# Patient Record
Sex: Female | Born: 1955 | Race: White | Hispanic: No | Marital: Married | Smoking: Former smoker
Health system: Southern US, Community
[De-identification: ages and names within clinical notes are randomized; demographics above are authoritative.]

---

## 2005-10-26 ENCOUNTER — Ambulatory Visit: Payer: Self-pay

## 2007-03-21 ENCOUNTER — Ambulatory Visit: Payer: Self-pay

## 2008-06-11 ENCOUNTER — Ambulatory Visit: Payer: Self-pay

## 2009-07-31 ENCOUNTER — Ambulatory Visit: Payer: Self-pay | Admitting: Internal Medicine

## 2009-08-09 ENCOUNTER — Ambulatory Visit: Payer: Self-pay | Admitting: Internal Medicine

## 2010-01-31 ENCOUNTER — Ambulatory Visit: Payer: Self-pay

## 2011-10-29 ENCOUNTER — Ambulatory Visit: Payer: Self-pay

## 2012-01-08 ENCOUNTER — Emergency Department: Payer: Self-pay | Admitting: Emergency Medicine

## 2012-07-16 ENCOUNTER — Ambulatory Visit: Payer: Self-pay | Admitting: General Surgery

## 2012-07-20 ENCOUNTER — Ambulatory Visit: Payer: Self-pay | Admitting: Medical

## 2012-11-06 ENCOUNTER — Ambulatory Visit: Payer: Self-pay | Admitting: Obstetrics and Gynecology

## 2015-02-17 DIAGNOSIS — F419 Anxiety disorder, unspecified: Secondary | ICD-10-CM | POA: Insufficient documentation

## 2017-10-28 ENCOUNTER — Emergency Department: Payer: Commercial Managed Care - PPO

## 2017-10-28 ENCOUNTER — Encounter: Payer: Self-pay | Admitting: Emergency Medicine

## 2017-10-28 ENCOUNTER — Other Ambulatory Visit: Payer: Self-pay

## 2017-10-28 ENCOUNTER — Inpatient Hospital Stay
Admission: EM | Admit: 2017-10-28 | Discharge: 2017-10-30 | DRG: 392 | Disposition: A | Discharge status: Home / Self Care | Payer: Commercial Managed Care - PPO | Attending: Internal Medicine | Admitting: Internal Medicine

## 2017-10-28 DIAGNOSIS — K2970 Gastritis, unspecified, without bleeding: Secondary | ICD-10-CM | POA: Diagnosis present

## 2017-10-28 DIAGNOSIS — Z888 Allergy status to other drugs, medicaments and biological substances status: Secondary | ICD-10-CM | POA: Diagnosis not present

## 2017-10-28 DIAGNOSIS — Z87891 Personal history of nicotine dependence: Secondary | ICD-10-CM | POA: Diagnosis not present

## 2017-10-28 DIAGNOSIS — Z79899 Other long term (current) drug therapy: Secondary | ICD-10-CM | POA: Diagnosis not present

## 2017-10-28 DIAGNOSIS — K922 Gastrointestinal hemorrhage, unspecified: Secondary | ICD-10-CM | POA: Diagnosis not present

## 2017-10-28 DIAGNOSIS — K5792 Diverticulitis of intestine, part unspecified, without perforation or abscess without bleeding: Secondary | ICD-10-CM | POA: Diagnosis present

## 2017-10-28 DIAGNOSIS — K5732 Diverticulitis of large intestine without perforation or abscess without bleeding: Secondary | ICD-10-CM | POA: Diagnosis present

## 2017-10-28 DIAGNOSIS — K269 Duodenal ulcer, unspecified as acute or chronic, without hemorrhage or perforation: Secondary | ICD-10-CM

## 2017-10-28 DIAGNOSIS — D62 Acute posthemorrhagic anemia: Secondary | ICD-10-CM | POA: Diagnosis present

## 2017-10-28 DIAGNOSIS — R109 Unspecified abdominal pain: Secondary | ICD-10-CM | POA: Diagnosis present

## 2017-10-28 DIAGNOSIS — K921 Melena: Secondary | ICD-10-CM

## 2017-10-28 LAB — COMPREHENSIVE METABOLIC PANEL
ALT: 19 U/L (ref 14–54)
AST: 25 U/L (ref 15–41)
Albumin: 4 g/dL (ref 3.5–5.0)
Alkaline Phosphatase: 64 U/L (ref 38–126)
Anion gap: 10 (ref 5–15)
BUN: 15 mg/dL (ref 6–20)
CHLORIDE: 100 mmol/L — AB (ref 101–111)
CO2: 24 mmol/L (ref 22–32)
CREATININE: 0.61 mg/dL (ref 0.44–1.00)
Calcium: 9 mg/dL (ref 8.9–10.3)
Glucose, Bld: 151 mg/dL — ABNORMAL HIGH (ref 65–99)
POTASSIUM: 3.9 mmol/L (ref 3.5–5.1)
SODIUM: 134 mmol/L — AB (ref 135–145)
Total Bilirubin: 0.7 mg/dL (ref 0.3–1.2)
Total Protein: 7.4 g/dL (ref 6.5–8.1)

## 2017-10-28 LAB — URINALYSIS, COMPLETE (UACMP) WITH MICROSCOPIC
BACTERIA UA: NONE SEEN
BILIRUBIN URINE: NEGATIVE
Glucose, UA: NEGATIVE mg/dL
Hgb urine dipstick: NEGATIVE
KETONES UR: 20 mg/dL — AB
LEUKOCYTES UA: NEGATIVE
Nitrite: NEGATIVE
PROTEIN: 30 mg/dL — AB
Specific Gravity, Urine: 1.023 (ref 1.005–1.030)
pH: 5 (ref 5.0–8.0)

## 2017-10-28 LAB — HEMOGLOBIN AND HEMATOCRIT, BLOOD
HCT: 26 % — ABNORMAL LOW (ref 35.0–47.0)
HEMATOCRIT: 26.7 % — AB (ref 35.0–47.0)
HEMATOCRIT: 24.8 % — AB (ref 35.0–47.0)
HEMOGLOBIN: 9.2 g/dL — AB (ref 12.0–16.0)
Hemoglobin: 8.7 g/dL — ABNORMAL LOW (ref 12.0–16.0)
Hemoglobin: 8.2 g/dL — ABNORMAL LOW (ref 12.0–16.0)

## 2017-10-28 LAB — CBC
HEMATOCRIT: 33 % — AB (ref 35.0–47.0)
Hemoglobin: 11.3 g/dL — ABNORMAL LOW (ref 12.0–16.0)
MCH: 31 pg (ref 26.0–34.0)
MCHC: 34.3 g/dL (ref 32.0–36.0)
MCV: 90.4 fL (ref 80.0–100.0)
Platelets: 311 10*3/uL (ref 150–440)
RBC: 3.65 MIL/uL — AB (ref 3.80–5.20)
RDW: 13.7 % (ref 11.5–14.5)
WBC: 11.3 10*3/uL — AB (ref 3.6–11.0)

## 2017-10-28 LAB — TYPE AND SCREEN
ABO/RH(D): O POS
ANTIBODY SCREEN: NEGATIVE

## 2017-10-28 LAB — LIPASE, BLOOD: LIPASE: 24 U/L (ref 11–51)

## 2017-10-28 LAB — GLUCOSE, CAPILLARY: Glucose-Capillary: 100 mg/dL — ABNORMAL HIGH (ref 65–99)

## 2017-10-28 MED ORDER — IOPAMIDOL (ISOVUE-300) INJECTION 61%
30.0000 mL | Freq: Once | INTRAVENOUS | Status: AC | PRN
  Administered 2017-10-28: 30 mL via ORAL

## 2017-10-28 MED ORDER — GI COCKTAIL ~~LOC~~
30.0000 mL | Freq: Once | ORAL | Status: AC
  Administered 2017-10-28: 30 mL via ORAL
  Filled 2017-10-28: qty 30

## 2017-10-28 MED ORDER — METRONIDAZOLE IN NACL 5-0.79 MG/ML-% IV SOLN
500.0000 mg | Freq: Three times a day (TID) | INTRAVENOUS | Status: DC
  Administered 2017-10-28 – 2017-10-30 (×6): 500 mg via INTRAVENOUS
  Filled 2017-10-28 (×9): qty 100

## 2017-10-28 MED ORDER — ALBUTEROL SULFATE (2.5 MG/3ML) 0.083% IN NEBU: 3.0000 mL | INHALATION_SOLUTION | Freq: Four times a day (QID) | RESPIRATORY_TRACT | Status: DC | PRN

## 2017-10-28 MED ORDER — PANTOPRAZOLE SODIUM 40 MG IV SOLR
40.0000 mg | Freq: Once | INTRAVENOUS | Status: AC
  Administered 2017-10-28: 40 mg via INTRAVENOUS
  Filled 2017-10-28: qty 40

## 2017-10-28 MED ORDER — SODIUM CHLORIDE 0.9% FLUSH
3.0000 mL | Freq: Two times a day (BID) | INTRAVENOUS | Status: DC
  Administered 2017-10-28 – 2017-10-30 (×5): 3 mL via INTRAVENOUS

## 2017-10-28 MED ORDER — IOPAMIDOL (ISOVUE-300) INJECTION 61%
100.0000 mL | Freq: Once | INTRAVENOUS | Status: AC | PRN
  Administered 2017-10-28: 100 mL via INTRAVENOUS

## 2017-10-28 MED ORDER — CIPROFLOXACIN IN D5W 400 MG/200ML IV SOLN
400.0000 mg | Freq: Two times a day (BID) | INTRAVENOUS | Status: DC
  Filled 2017-10-28 (×2): qty 200

## 2017-10-28 MED ORDER — ONDANSETRON HCL 4 MG PO TABS: 4.0000 mg | ORAL_TABLET | Freq: Four times a day (QID) | ORAL | Status: DC | PRN

## 2017-10-28 MED ORDER — CIPROFLOXACIN IN D5W 400 MG/200ML IV SOLN
400.0000 mg | Freq: Two times a day (BID) | INTRAVENOUS | Status: DC
  Administered 2017-10-28 – 2017-10-30 (×4): 400 mg via INTRAVENOUS
  Filled 2017-10-28 (×5): qty 200

## 2017-10-28 MED ORDER — METRONIDAZOLE IN NACL 5-0.79 MG/ML-% IV SOLN
500.0000 mg | Freq: Once | INTRAVENOUS | Status: AC
  Administered 2017-10-28: 500 mg via INTRAVENOUS
  Filled 2017-10-28: qty 100

## 2017-10-28 MED ORDER — ONDANSETRON HCL 4 MG/2ML IJ SOLN: 4.0000 mg | Freq: Four times a day (QID) | INTRAMUSCULAR | Status: DC | PRN

## 2017-10-28 MED ORDER — PIPERACILLIN-TAZOBACTAM 3.375 G IVPB 30 MIN
3.3750 g | Freq: Once | INTRAVENOUS | Status: DC
  Filled 2017-10-28: qty 50

## 2017-10-28 MED ORDER — CIPROFLOXACIN IN D5W 400 MG/200ML IV SOLN
400.0000 mg | Freq: Once | INTRAVENOUS | Status: AC
  Administered 2017-10-28: 400 mg via INTRAVENOUS
  Filled 2017-10-28: qty 200

## 2017-10-28 MED ORDER — MORPHINE SULFATE (PF) 2 MG/ML IV SOLN: 2.0000 mg | INTRAVENOUS | Status: DC | PRN

## 2017-10-28 MED ORDER — FENTANYL CITRATE (PF) 100 MCG/2ML IJ SOLN
50.0000 ug | Freq: Once | INTRAMUSCULAR | Status: AC
  Administered 2017-10-28: 50 ug via INTRAVENOUS
  Filled 2017-10-28: qty 2

## 2017-10-28 MED ORDER — PANTOPRAZOLE SODIUM 40 MG IV SOLR
40.0000 mg | Freq: Two times a day (BID) | INTRAVENOUS | Status: DC
  Administered 2017-10-28 – 2017-10-30 (×5): 40 mg via INTRAVENOUS
  Filled 2017-10-28 (×6): qty 40

## 2017-10-28 MED ORDER — SODIUM CHLORIDE 0.9 % IV SOLN
INTRAVENOUS | Status: AC
  Administered 2017-10-28: 11:00:00 via INTRAVENOUS

## 2017-10-28 MED ORDER — TRAMADOL HCL 50 MG PO TABS
50.0000 mg | ORAL_TABLET | Freq: Four times a day (QID) | ORAL | Status: DC | PRN
  Administered 2017-10-28 – 2017-10-29 (×2): 50 mg via ORAL
  Filled 2017-10-28 (×2): qty 1

## 2017-10-28 MED ORDER — SODIUM CHLORIDE 0.9 % IV BOLUS (SEPSIS)
1000.0000 mL | Freq: Once | INTRAVENOUS | Status: AC
  Administered 2017-10-28: 1000 mL via INTRAVENOUS

## 2017-10-28 MED ORDER — ACETAMINOPHEN 325 MG PO TABS
650.0000 mg | ORAL_TABLET | Freq: Four times a day (QID) | ORAL | Status: DC | PRN
  Administered 2017-10-30: 650 mg via ORAL
  Filled 2017-10-28: qty 2

## 2017-10-28 MED ORDER — ACETAMINOPHEN 650 MG RE SUPP: 650.0000 mg | Freq: Four times a day (QID) | RECTAL | Status: DC | PRN

## 2017-10-28 NOTE — ED Provider Notes (Signed)
[facility] Emergency Department Provider Note  ____________________________________________   I have reviewed the triage vital signs and the nursing notes.   HISTORY  Chief Complaint Abdominal Pain    HPI Vida RiggerFrances Paladino is a 61 y.o. female who states she has had a C-section remotely otherwise no abdominal pathology, presents today complaining of left-sided abdominal pain that radiates now towards her epigastric region.  She has had no appetite.  She has not had diarrhea but she has had some loose stools which were "black".  No history of GI bleeding in the past.  Nothing makes the pain worse nothing makes it better.  She has not had any food for the last 2 days that she has no appetite.  No history of diverticulitis.  She did have remote colonoscopy which she reports is negative.  She has not tried anything at home to make this better, she she has had some subjective fever at home but does not taken her temperature.  It was gradual in onset, sharp/dull      History reviewed. No pertinent past medical history.  There are no active problems to display for this patient.   History reviewed. No pertinent surgical history.  Prior to Admission medications   Not on File    Allergies Patient has no known allergies.  History reviewed. No pertinent family history.  Social History Social History   Tobacco Use  . Smoking status: Former Smoker    Last attempt to quit: 2017    Years since quitting: 1.8  . Smokeless tobacco: Never Used  Substance Use Topics  . Alcohol use: No    Frequency: Never  . Drug use: No    Review of Systems Constitutional: No fever/chills Eyes: No visual changes. ENT: No sore throat. No stiff neck no neck pain Cardiovascular: Denies chest pain. Respiratory: Denies shortness of breath. Gastrointestinal:   no vomiting.  No diarrhea.  No constipation. Genitourinary: Negative for dysuria. Musculoskeletal: Negative lower  extremity swelling Skin: Negative for rash. Neurological: Negative for severe headaches, focal weakness or numbness.   ____________________________________________   PHYSICAL EXAM:  VITAL SIGNS: ED Triage Vitals  Enc Vitals Group     BP 10/28/17 0632 (!) 152/92     Pulse Rate 10/28/17 0632 (!) 125     Resp 10/28/17 0632 20     Temp 10/28/17 0632 98.3 F (36.8 C)     Temp Source 10/28/17 0632 Oral     SpO2 10/28/17 0632 97 %     Weight 10/28/17 0632 143 lb (64.9 kg)     Height 10/28/17 0632 5' (1.524 m)     Head Circumference --      Peak Flow --      Pain Score 10/28/17 0631 6     Pain Loc --      Pain Edu? --      Excl. in GC? --     Constitutional: Alert and oriented. Well appearing and in no acute distress. Eyes: Conjunctivae are normal Head: Atraumatic HEENT: No congestion/rhinnorhea. Mucous membranes are moist.  Oropharynx non-erythematous Neck:   Nontender with no meningismus, no masses, no stridor Cardiovascular: Normal rate, regular rhythm. Grossly normal heart sounds.  Good peripheral circulation. Respiratory: Normal respiratory effort.  No retractions. Lungs CTAB. Abdominal: Soft and tender to palpation in the left upper quadrant left mid to mid and lower abdomen as well as epigastric.  No guarding or rebound Back:  There is no focal tenderness or step off.  there  is no midline tenderness there are no lesions noted. there is no CVA tenderness Musculoskeletal: No lower extremity tenderness, no upper extremity tenderness. No joint effusions, no DVT signs strong distal pulses no edema Neurologic:  Normal speech and language. No gross focal neurologic deficits are appreciated.  Skin:  Skin is warm, dry and intact. No rash noted. Psychiatric: Mood and affect are normal. Speech and behavior are normal.  ____________________________________________   LABS (all labs ordered are listed, but only abnormal results are displayed)  Labs Reviewed  COMPREHENSIVE METABOLIC  PANEL - Abnormal; Notable for the following components:      Result Value   Sodium 134 (*)    Chloride 100 (*)    Glucose, Bld 151 (*)    All other components within normal limits  CBC - Abnormal; Notable for the following components:   WBC 11.3 (*)    RBC 3.65 (*)    Hemoglobin 11.3 (*)    HCT 33.0 (*)    All other components within normal limits  URINALYSIS, COMPLETE (UACMP) WITH MICROSCOPIC - Abnormal; Notable for the following components:   Color, Urine YELLOW (*)    APPearance HAZY (*)    Ketones, ur 20 (*)    Protein, ur 30 (*)    Squamous Epithelial / LPF 0-5 (*)    All other components within normal limits  LIPASE, BLOOD  TYPE AND SCREEN    Pertinent labs  results that were available during my care of the patient were reviewed by me and considered in my medical decision making (see chart for details). ____________________________________________  EKG  I personally interpreted any EKGs ordered by me or triage Sinus rate 105,T elevation or depression is a right bundle branch block noted ____________________________________________  RADIOLOGY  Pertinent labs & imaging results that were available during my care of the patient were reviewed by me and considered in my medical decision making (see chart for details). If possible, patient and/or family made aware of any abnormal findings. ____________________________________________    PROCEDURES  Procedure(s) performed: None  Procedures  Critical Care performed: None  ____________________________________________   INITIAL IMPRESSION / ASSESSMENT AND PLAN / ED COURSE  Pertinent labs & imaging results that were available during my care of the patient were reviewed by me and considered in my medical decision making (see chart for details).  Patient here for very reproducible abdominal pain, guaiac positive rectal exam, female nurse chaperone present.  I did a CT scan which shows diverticular inflammation which we  are giving her antibiotics for as well as evidence of duodenitis, which is likely responsible for the bright red blood per rectum.  She was somewhat tachycardic upon arrival here we are giving her fluids, I think patient will likely given this constellation of events including black stool, tachycardia, diverticulitis, with no ability to eat at home benefit from hospital admission for further observation with further evaluation.    ____________________________________________   FINAL CLINICAL IMPRESSION(S) / ED DIAGNOSES  Final diagnoses:  None      This chart was dictated using voice recognition software.  Despite best efforts to proofread,  errors can occur which can change meaning.      Jeanmarie PlantMcShane, Dajour Pierpoint A, MD 10/28/17 [id number]

## 2017-10-28 NOTE — H&P (Signed)
[facility] [place] [facility]   PATIENT NAME: Bailey Dorsey    MR#:  [id number]  DATE OF BIRTH:  [birth year]  DATE OF ADMISSION:  10/28/2017  PRIMARY CARE PHYSICIAN: Patient, No Pcp Per   REQUESTING/REFERRING PHYSICIAN: Neta MendsMc Shane  CHIEF COMPLAINT:   Abdominal pain HISTORY OF PRESENT ILLNESS:  Bailey RiggerFrances Oehler  is a 61 y.o. female with a known history of no significant past medical history is presenting to the ED with a chief complaint of left-sided abdominal pain radiating to epigastric area.  Patient also is reporting black colored stool.  Denies any chest pain.  Could not tolerate diet because of the pain.  CT abdomen has revealed a diverticulitis of the sigmoid colon and possible duodenitis.  Hospitalist team was called to admit the patient.  PAST MEDICAL HISTORY:  History reviewed. No pertinent past medical history.  PAST SURGICAL HISTOIRY:  C-section  SOCIAL HISTORY:   Social History   Tobacco Use  . Smoking status: Former Smoker    Last attempt to quit: 2017    Years since quitting: 1.8  . Smokeless tobacco: Never Used  Substance Use Topics  . Alcohol use: No    Frequency: Never    FAMILY HISTORY:   Family History  Problem Relation Age of Onset  . Heart Problems Mother   . Renal Disease Mother   . Heart attack Father   . Diabetes Sister     DRUG ALLERGIES:   Allergies  Allergen Reactions  . Amoxicillin-Pot Clavulanate Nausea And Vomiting    REVIEW OF SYSTEMS:  CONSTITUTIONAL: No fever, fatigue or weakness.  EYES: No blurred or double vision.  EARS, NOSE, AND THROAT: No tinnitus or ear pain.  RESPIRATORY: No cough, shortness of breath, wheezing or hemoptysis.  CARDIOVASCULAR: No chest pain, orthopnea, edema.  GASTROINTESTINAL: Epigastric and left upper quadrant abdominal pain nausea denies any vomiting.  Reporting black tarry stool GENITOURINARY: No dysuria, hematuria.  ENDOCRINE: No polyuria, nocturia,  HEMATOLOGY:  No anemia, easy bruising or bleeding SKIN: No rash or lesion. MUSCULOSKELETAL: No joint pain or arthritis.   NEUROLOGIC: No tingling, numbness, weakness.  PSYCHIATRY: No anxiety or depression.   MEDICATIONS AT HOME:   Prior to Admission medications   Medication Sig Start Date End Date Taking? Authorizing Provider  albuterol (PROAIR HFA) 108 (90 Base) MCG/ACT inhaler Inhale 2 puffs every 6 (six) hours as needed into the lungs for wheezing. 09/13/17 09/13/18 Yes [provider]  cetirizine (ZYRTEC) 10 MG tablet Take 10 mg daily as needed by mouth for allergies.   Yes [provider]  ciprofloxacin (CIPRO) 500 MG tablet Take 500 mg 2 (two) times daily by mouth. 10/25/17  Yes [provider]  omeprazole (PRILOSEC OTC) 20 MG tablet Take 20 mg daily by mouth. 10/25/17   [provider]      VITAL SIGNS:  Blood pressure 125/64, pulse (!) 102, temperature 99.1 F (37.3 C), temperature source Oral, resp. rate 18, height 5' (1.524 m), weight 64.4 kg (142 lb), SpO2 100 %.  PHYSICAL EXAMINATION:  GENERAL:  61 y.o.-year-old patient lying in the bed with no acute distress.  EYES: Pupils equal, round, reactive to light and accommodation. No scleral icterus. Extraocular muscles intact.  HEENT: Head atraumatic, normocephalic. Oropharynx and nasopharynx clear.  NECK:  Supple, no jugular venous distention. No thyroid enlargement, no tenderness.  LUNGS: Normal breath sounds bilaterally, no wheezing, rales,rhonchi or crepitation. No use of accessory muscles of respiration.  CARDIOVASCULAR: S1, S2 normal.  No murmurs, rubs, or gallops.  ABDOMEN: Soft, left upper quadrant and epigastric area are tender but no rebound tenderness, nondistended. Bowel sounds present. No organomegaly or mass.  EXTREMITIES: No pedal edema, cyanosis, or clubbing.  NEUROLOGIC: Cranial nerves II through XII are intact. Muscle strength 5/5 in all extremities. Sensation intact. Gait not checked.   PSYCHIATRIC: The patient is alert and oriented x 3.  SKIN: No obvious rash, lesion, or ulcer.   LABORATORY PANEL:   CBC Recent Labs  Lab 10/28/17 0633 10/28/17 1117  WBC 11.3*  --   HGB 11.3* 9.2*  HCT 33.0* 26.7*  PLT 311  --    ------------------------------------------------------------------------------------------------------------------  Chemistries  Recent Labs  Lab 10/28/17 0633  NA 134*  K 3.9  CL 100*  CO2 24  GLUCOSE 151*  BUN 15  CREATININE 0.61  CALCIUM 9.0  AST 25  ALT 19  ALKPHOS 64  BILITOT 0.7   ------------------------------------------------------------------------------------------------------------------  Cardiac Enzymes No results for input(s): TROPONINI in the last 168 hours. ------------------------------------------------------------------------------------------------------------------  RADIOLOGY:  Ct Abdomen Pelvis W Contrast  Result Date: 10/28/2017 CLINICAL DATA:  Vomiting scratched abdominal pain, unspecified. Umbilical pain for 4 days. Vomiting. Black stools. Weight gain. EXAM: CT ABDOMEN AND PELVIS WITH CONTRAST TECHNIQUE: Multidetector CT imaging of the abdomen and pelvis was performed using the standard protocol following bolus administration of intravenous contrast. CONTRAST:  30mL ISOVUE-300 IOPAMIDOL (ISOVUE-300) INJECTION 61%, 100mL ISOVUE-300 IOPAMIDOL (ISOVUE-300) INJECTION 61% COMPARISON:  None. FINDINGS: Lower chest: The lung bases are clear without focal nodule, mass, or airspace disease. Heart size is normal. Hepatobiliary: No focal liver abnormality is seen. No gallstones, gallbladder wall thickening, or biliary dilatation. Pancreas: Unremarkable. No pancreatic ductal dilatation or surrounding inflammatory changes. Spleen: Normal in size without focal abnormality. Adrenals/Urinary Tract: The adrenal glands are normal. Kidneys and ureters are within normal limits. No focal mass lesion is present. There is no stone. The urinary  bladder is within normal limits. Stomach/Bowel: The proximal stomach is within normal limits. There is eccentric thickening and edema of the inferior medial duodenum. No discrete mass lesion is present. Small bowel is unremarkable. Contrast is present throughout the colon without obstruction or mass. The ileocolic junction is normal. The appendix is visualized and within normal limits. The ascending and transverse colon are within normal limits. Descending colon is normal. Diverticular changes are present throughout the sigmoid colon. Focal inflammatory changes are present with some edema into the mesenteries. There is no focal fluid collection or free air. Vascular/Lymphatic: Aortic Atherosclerosis (ICD10-I70.0). There is no aneurysm or stenosis. Inflammatory nodes are present about the duodenum. No other significant adenopathy is present. Reproductive: Uterus and bilateral adnexa are unremarkable. Other: No abdominal wall hernia or abnormality. No abdominopelvic ascites. Musculoskeletal: Degenerative changes are noted in the facet joints bilaterally. Slight anterolisthesis is present at L5-S1. AP alignment is otherwise anatomic. IMPRESSION: 1. Sigmoid diverticulitis without complicating features. 2. Asymmetric thickening and edema of the duodenum is likely related to inflammation. Endoscopy would be useful to evaluate for inflammation versus neoplasm. Subcentimeter lymph nodes appear inflammatory. 3. Aortic atherosclerosis without aneurysm. These results were called by telephone at the time of interpretation on 10/28/2017 at 8:57 am to Dr. Ileana RoupJAMES MCSHANE , who verbally acknowledged these results. Electronically Signed   By: Marin Robertshristopher  Mattern M.D.   On: 10/28/2017 09:00    EKG:  No orders found for this or any previous visit.  IMPRESSION AND PLAN:   61 year old female presenting to the ED with a chief complaint of acute left  upper quadrant and epigastric abdominal pain and not eating well for the past 2  days from the abdominal pain.  #Acute left upper quadrant and epigastric abdominal pain with melena secondary to acute duodenitis CT abdomen has revealed acute sigmoid diverticulitis Patient is admitted to MedSurg unit N.p.o. IV Protonix GI consult is placed Hemodynamically stable at this time monitor hemoglobin hematocrit and transfuse as needed Hydrate with IV fluids IV ciprofloxacin and Flagyl  #Acute sigmoid diverticulitis IV ciprofloxacin and Flagyl K.  IV fluids, pain management as needed  #Melena could be from duodenitis IV fluids, Protonix, n.p.o., GI consult, monitor hemoglobin and hematocrit every 4 hours    All the records are reviewed and case discussed with ED provider. Management plans discussed with the patient, she is  in agreement.  CODE STATUS: fc  TOTAL TIME TAKING CARE OF THIS PATIENT: 45 minutes.   Note: This dictation was prepared with Dragon dictation along with smaller phrase technology. Any transcriptional errors that result from this process are unintentional.  Ramonita Lab M.D on 10/28/2017 at 3:00 PM  Between 7am to 6pm - Pager - [phone]  After 6pm go to [email or web address] - password [email or web address]  [facility]  Office  [phone]  CC: Primary care physician; Patient, No Pcp Per

## 2017-10-28 NOTE — ED Notes (Signed)
Patient transported to CT 

## 2017-10-28 NOTE — Consult Note (Signed)
Bailey Miniumarren Bronson Bressman, MD [facility]  [address] [place], [place] [address] Phone: [phone] Fax : [phone]  Consultation  Referring Provider:     Dr. Amado CoeGouru Primary Care Physician:  Patient, No Pcp Per Primary Gastroenterologist:  Gentry FitzUnassigned         Reason for Consultation:     Abdominal pain  Date of Admission:  10/28/2017 Date of Consultation:  10/28/2017         HPI:   Bailey Dorsey is a 61 y.o. female Who is admitted to the ER with abdominal pain that was in the left side.  The patient also reports that she had some epigastric pain.  Patient states she has been taking BC powders for 30 years but started to have an upset stomach about a week ago when stop taking BC powders.  The patient also reports that she is been having black stools although they became a little lighter today.  She states that she could not eat because of the pain.  She had a CT scan that showed her to have diverticulitis in the sigmoid colon and possible duodenitis.  The patient also reports that she had a colonoscopy in 2014 by Dr. Lemar LivingsByrnett.  She states that she was not told that she had any diverticulosis at that time and also states that she did not have any polyps at that time. She also reports that her abdominal pain has completely resolved. The patient's hemoglobin on admission was 11.3 that went down to 9.2 this afternoon and is 8.7 this evening.  History reviewed. No pertinent past medical history.  History reviewed. No pertinent surgical history.  Prior to Admission medications   Medication Sig Start Date End Date Taking? Authorizing Provider  albuterol (PROAIR HFA) 108 (90 Base) MCG/ACT inhaler Inhale 2 puffs every 6 (six) hours as needed into the lungs for wheezing. 09/13/17 09/13/18 Yes [provider]  cetirizine (ZYRTEC) 10 MG tablet Take 10 mg daily as needed by mouth for allergies.   Yes [provider]  ciprofloxacin (CIPRO) 500 MG tablet Take 500 mg 2 (two) times daily by  mouth. 10/25/17  Yes [provider]  omeprazole (PRILOSEC OTC) 20 MG tablet Take 20 mg daily by mouth. 10/25/17   [provider]    Family History  Problem Relation Age of Onset  . Heart Problems Mother   . Renal Disease Mother   . Heart attack Father   . Diabetes Sister      Social History   Tobacco Use  . Smoking status: Former Smoker    Last attempt to quit: 2017    Years since quitting: 1.8  . Smokeless tobacco: Never Used  Substance Use Topics  . Alcohol use: No    Frequency: Never  . Drug use: No    Allergies as of 10/28/2017 - Review Complete 10/28/2017  Allergen Reaction Noted  . Amoxicillin-pot clavulanate Nausea And Vomiting 09/06/2016    Review of Systems:    All systems reviewed and negative except where noted in HPI.   Physical Exam:  Vital signs in last 24 hours: Temp:  [98.3 F (36.8 C)-99.1 F (37.3 C)] 99.1 F (37.3 C) (11/12 1118) Pulse Rate:  [102-125] 102 (11/12 0930) Resp:  [17-20] 18 (11/12 1118) BP: (102-152)/(64-92) 125/64 (11/12 1118) SpO2:  [96 %-100 %] 100 % (11/12 1118) Weight:  [142 lb (64.4 kg)-143 lb (64.9 kg)] 142 lb (64.4 kg) (11/12 1118) Last BM Date: 10/28/17 General:   Pleasant, cooperative in NAD  Head:  Normocephalic and atraumatic. Eyes:   No icterus.   Conjunctiva pink. PERRLA. Ears:  Normal auditory acuity. Neck:  Supple; no masses or thyroidomegaly Lungs: Respirations even and unlabored. Lungs clear to auscultation bilaterally.   No wheezes, crackles, or rhonchi.  Heart:  Regular rate and rhythm;  Without murmur, clicks, rubs or gallops Abdomen:  Soft, nondistended, nontender. Normal bowel sounds. No appreciable masses or hepatomegaly.  No rebound or guarding.  Rectal:  Not performed. Msk:  Symmetrical without gross deformities.    Extremities:  Without edema, cyanosis or clubbing. Neurologic:  Alert and oriented x3;  grossly normal neurologically. Skin:  Intact without significant lesions or  rashes. Cervical Nodes:  No significant cervical adenopathy. Psych:  Alert and cooperative. Normal affect.  LAB RESULTS: Recent Labs    10/28/17 0633 10/28/17 1117 10/28/17 1630  WBC 11.3*  --   --   HGB 11.3* 9.2* 8.7*  HCT 33.0* 26.7* 26.0*  PLT 311  --   --    BMET Recent Labs    10/28/17 0633  NA 134*  K 3.9  CL 100*  CO2 24  GLUCOSE 151*  BUN 15  CREATININE 0.61  CALCIUM 9.0   LFT Recent Labs    10/28/17 0633  PROT 7.4  ALBUMIN 4.0  AST 25  ALT 19  ALKPHOS 64  BILITOT 0.7   PT/INR No results for input(s): LABPROT, INR in the last 72 hours.  STUDIES: Ct Abdomen Pelvis W Contrast  Result Date: 10/28/2017 CLINICAL DATA:  Vomiting scratched abdominal pain, unspecified. Umbilical pain for 4 days. Vomiting. Black stools. Weight gain. EXAM: CT ABDOMEN AND PELVIS WITH CONTRAST TECHNIQUE: Multidetector CT imaging of the abdomen and pelvis was performed using the standard protocol following bolus administration of intravenous contrast. CONTRAST:  30mL ISOVUE-300 IOPAMIDOL (ISOVUE-300) INJECTION 61%, 100mL ISOVUE-300 IOPAMIDOL (ISOVUE-300) INJECTION 61% COMPARISON:  None. FINDINGS: Lower chest: The lung bases are clear without focal nodule, mass, or airspace disease. Heart size is normal. Hepatobiliary: No focal liver abnormality is seen. No gallstones, gallbladder wall thickening, or biliary dilatation. Pancreas: Unremarkable. No pancreatic ductal dilatation or surrounding inflammatory changes. Spleen: Normal in size without focal abnormality. Adrenals/Urinary Tract: The adrenal glands are normal. Kidneys and ureters are within normal limits. No focal mass lesion is present. There is no stone. The urinary bladder is within normal limits. Stomach/Bowel: The proximal stomach is within normal limits. There is eccentric thickening and edema of the inferior medial duodenum. No discrete mass lesion is present. Small bowel is unremarkable. Contrast is present throughout the colon  without obstruction or mass. The ileocolic junction is normal. The appendix is visualized and within normal limits. The ascending and transverse colon are within normal limits. Descending colon is normal. Diverticular changes are present throughout the sigmoid colon. Focal inflammatory changes are present with some edema into the mesenteries. There is no focal fluid collection or free air. Vascular/Lymphatic: Aortic Atherosclerosis (ICD10-I70.0). There is no aneurysm or stenosis. Inflammatory nodes are present about the duodenum. No other significant adenopathy is present. Reproductive: Uterus and bilateral adnexa are unremarkable. Other: No abdominal wall hernia or abnormality. No abdominopelvic ascites. Musculoskeletal: Degenerative changes are noted in the facet joints bilaterally. Slight anterolisthesis is present at L5-S1. AP alignment is otherwise anatomic. IMPRESSION: 1. Sigmoid diverticulitis without complicating features. 2. Asymmetric thickening and edema of the duodenum is likely related to inflammation. Endoscopy would be useful to evaluate for inflammation versus neoplasm. Subcentimeter lymph nodes appear inflammatory. 3. Aortic atherosclerosis without aneurysm. These  results were called by telephone at the time of interpretation on 10/28/2017 at 8:57 am to Dr. Ileana Roup , who verbally acknowledged these results. Electronically Signed   By: Marin Roberts M.D.   On: 10/28/2017 09:00      Impression / Plan:   Bailey Dorsey is a 61 y.o. y/o female who is admitted with abdominal pain and found to have diverticulitis. The patient has also had melena and a drop in her hemoglobin.  The patient was started on a clear liquid diet and had just eaten in and drank just before I came to see her today.  The patient will be set up for a EGD for tomorrow.  Patient reports that she has had a lot of friends to have had problems with EGDs and she is very nervous about going through this.  The patient  has been explained the risks and benefits of the procedure and risks of not doing the procedure and continue bleeding.  The patient agrees to undergo the upper endoscopy.  The patient has also been told because of her recent bout of diverticulitis and no history of diverticulosis from her last colonoscopy as reported by her, she should undergo a colonoscopy after discharge.  She should wait a minimum of 6 weeks after this bout of diverticulitis to undergo the colonoscopy.  The patient has been explained the plan and agrees with it.  Thank you for involving me in the care of this patient.      LOS: 0 days   Bailey Minium, MD  10/28/2017, 8:13 PM   Note: This dictation was prepared with Dragon dictation along with smaller phrase technology. Any transcriptional errors that result from this process are unintentional.

## 2017-10-28 NOTE — ED Triage Notes (Signed)
Patient ambulatory to triage with complaints of periumbilical pain 6/10 since Wednesday.  Intermittent dull aching.  Pt reports receiving abx from PCP visit on Friday for UTI. Pt reports dark stool and appears pale. No acute distress noted.

## 2017-10-29 ENCOUNTER — Encounter
Admission: EM | Disposition: A | Discharge status: Home / Self Care | Payer: Self-pay | Source: Home / Self Care | Attending: Internal Medicine

## 2017-10-29 ENCOUNTER — Inpatient Hospital Stay: Payer: Commercial Managed Care - PPO | Admitting: Anesthesiology

## 2017-10-29 DIAGNOSIS — K921 Melena: Secondary | ICD-10-CM

## 2017-10-29 DIAGNOSIS — K269 Duodenal ulcer, unspecified as acute or chronic, without hemorrhage or perforation: Secondary | ICD-10-CM

## 2017-10-29 HISTORY — PX: ESOPHAGOGASTRODUODENOSCOPY (EGD) WITH PROPOFOL: SHX5813

## 2017-10-29 LAB — IRON AND TIBC
IRON: 15 ug/dL — AB (ref 28–170)
Saturation Ratios: 5 % — ABNORMAL LOW (ref 10.4–31.8)
TIBC: 291 ug/dL (ref 250–450)
UIBC: 276 ug/dL

## 2017-10-29 LAB — COMPREHENSIVE METABOLIC PANEL
ALK PHOS: 40 U/L (ref 38–126)
ALT: 13 U/L — AB (ref 14–54)
AST: 20 U/L (ref 15–41)
Albumin: 2.9 g/dL — ABNORMAL LOW (ref 3.5–5.0)
Anion gap: 6 (ref 5–15)
BUN: 8 mg/dL (ref 6–20)
CALCIUM: 7.9 mg/dL — AB (ref 8.9–10.3)
CO2: 25 mmol/L (ref 22–32)
CREATININE: 0.5 mg/dL (ref 0.44–1.00)
Chloride: 107 mmol/L (ref 101–111)
Glucose, Bld: 92 mg/dL (ref 65–99)
Potassium: 3.5 mmol/L (ref 3.5–5.1)
Sodium: 138 mmol/L (ref 135–145)
Total Bilirubin: 0.5 mg/dL (ref 0.3–1.2)
Total Protein: 5.5 g/dL — ABNORMAL LOW (ref 6.5–8.1)

## 2017-10-29 LAB — CBC
HCT: 24.5 % — ABNORMAL LOW (ref 35.0–47.0)
Hemoglobin: 8 g/dL — ABNORMAL LOW (ref 12.0–16.0)
MCH: 30.5 pg (ref 26.0–34.0)
MCHC: 32.8 g/dL (ref 32.0–36.0)
MCV: 92.8 fL (ref 80.0–100.0)
Platelets: 247 10*3/uL (ref 150–440)
RBC: 2.64 MIL/uL — AB (ref 3.80–5.20)
RDW: 13.9 % (ref 11.5–14.5)
WBC: 7.3 10*3/uL (ref 3.6–11.0)

## 2017-10-29 LAB — FERRITIN: FERRITIN: 27 ng/mL (ref 11–307)

## 2017-10-29 LAB — VITAMIN B12: Vitamin B-12: 404 pg/mL (ref 180–914)

## 2017-10-29 LAB — RETICULOCYTES
RBC.: 2.83 MIL/uL — AB (ref 3.80–5.20)
RETIC COUNT ABSOLUTE: 104.7 10*3/uL (ref 19.0–183.0)
RETIC CT PCT: 3.7 % — AB (ref 0.4–3.1)

## 2017-10-29 LAB — FOLATE: Folate: 30 ng/mL (ref >5.9)

## 2017-10-29 LAB — HIV ANTIBODY (ROUTINE TESTING W REFLEX): HIV SCREEN 4TH GENERATION: NONREACTIVE

## 2017-10-29 SURGERY — ESOPHAGOGASTRODUODENOSCOPY (EGD) WITH PROPOFOL
Anesthesia: General

## 2017-10-29 MED ORDER — TEMAZEPAM 15 MG PO CAPS
15.0000 mg | ORAL_CAPSULE | Freq: Every evening | ORAL | Status: DC | PRN
  Administered 2017-10-29: 15 mg via ORAL
  Filled 2017-10-29 (×2): qty 1

## 2017-10-29 MED ORDER — PROPOFOL 10 MG/ML IV BOLUS
INTRAVENOUS | Status: DC | PRN
  Administered 2017-10-29: 20 mg via INTRAVENOUS
  Administered 2017-10-29: 50 mg via INTRAVENOUS

## 2017-10-29 MED ORDER — LIDOCAINE HCL (PF) 2 % IJ SOLN
INTRAMUSCULAR | Status: DC | PRN
  Administered 2017-10-29: 50 mg via INTRADERMAL

## 2017-10-29 MED ORDER — PROPOFOL 500 MG/50ML IV EMUL
INTRAVENOUS | Status: DC | PRN
  Administered 2017-10-29: 150 ug/kg/min via INTRAVENOUS

## 2017-10-29 MED ORDER — PROPOFOL 500 MG/50ML IV EMUL
INTRAVENOUS | Status: AC
  Filled 2017-10-29: qty 50

## 2017-10-29 MED ORDER — SODIUM CHLORIDE 0.9 % IV SOLN
INTRAVENOUS | Status: DC | PRN
  Administered 2017-10-29: 11:00:00 via INTRAVENOUS

## 2017-10-29 NOTE — Anesthesia Post-op Follow-up Note (Signed)
Anesthesia QCDR form completed.        

## 2017-10-29 NOTE — Progress Notes (Signed)
[facility] [place] [facility]   PATIENT NAME: Bailey RiggerFrances Dorsey    MR#:  [id number]  DATE OF BIRTH:  29-Dec-1955  SUBJECTIVE:   Patient feeling better this am no abdominal pain  REVIEW OF SYSTEMS:    Review of Systems  Constitutional: Negative for fever, chills weight loss HENT: Negative for ear pain, nosebleeds, congestion, facial swelling, rhinorrhea, neck pain, neck stiffness and ear discharge.   Respiratory: Negative for cough, shortness of breath, wheezing  Cardiovascular: Negative for chest pain, palpitations and leg swelling.  Gastrointestinal: Negative for heartburn, abdominal pain, vomiting, diarrhea or consitpation Genitourinary: Negative for dysuria, urgency, frequency, hematuria Musculoskeletal: Negative for back pain or joint pain Neurological: Negative for dizziness, seizures, syncope, focal weakness,  numbness and headaches.  Hematological: Does not bruise/bleed easily.  Psychiatric/Behavioral: Negative for hallucinations, confusion, dysphoric mood    Tolerating Diet: npo      DRUG ALLERGIES:   Allergies  Allergen Reactions  . Amoxicillin-Pot Clavulanate Nausea And Vomiting    VITALS:  Blood pressure (!) 98/41, pulse 84, temperature (!) 96.8 F (36 C), temperature source Tympanic, resp. rate 12, height 5' (1.524 m), weight 64.4 kg (142 lb), SpO2 97 %.  PHYSICAL EXAMINATION:  Constitutional: Appears well-developed and well-nourished. No distress. HENT: Normocephalic. Marland Kitchen. Oropharynx is clear and moist.  Eyes: Conjunctivae and EOM are normal. PERRLA, no scleral icterus.  Neck: Normal ROM. Neck supple. No JVD. No tracheal deviation. CVS: RRR, S1/S2 +, no murmurs, no gallops, no carotid bruit.  Pulmonary: Effort and breath sounds normal, no stridor, rhonchi, wheezes, rales.  Abdominal: Soft. BS +,  no distension, tenderness, rebound or guarding.  Musculoskeletal: Normal range of motion. No edema and no tenderness.  Neuro: Alert. CN 2-12  grossly intact. No focal deficits. Skin: Skin is warm and dry. No rash noted. Psychiatric: Normal mood and affect.      LABORATORY PANEL:   CBC Recent Labs  Lab 10/29/17 0423  WBC 7.3  HGB 8.0*  HCT 24.5*  PLT 247   ------------------------------------------------------------------------------------------------------------------  Chemistries  Recent Labs  Lab 10/29/17 0423  NA 138  K 3.5  CL 107  CO2 25  GLUCOSE 92  BUN 8  CREATININE 0.50  CALCIUM 7.9*  AST 20  ALT 13*  ALKPHOS 40  BILITOT 0.5   ------------------------------------------------------------------------------------------------------------------  Cardiac Enzymes No results for input(s): TROPONINI in the last 168 hours. ------------------------------------------------------------------------------------------------------------------  RADIOLOGY:  Ct Abdomen Pelvis W Contrast  Result Date: 10/28/2017 CLINICAL DATA:  Vomiting scratched abdominal pain, unspecified. Umbilical pain for 4 days. Vomiting. Black stools. Weight gain. EXAM: CT ABDOMEN AND PELVIS WITH CONTRAST TECHNIQUE: Multidetector CT imaging of the abdomen and pelvis was performed using the standard protocol following bolus administration of intravenous contrast. CONTRAST:  30mL ISOVUE-300 IOPAMIDOL (ISOVUE-300) INJECTION 61%, 100mL ISOVUE-300 IOPAMIDOL (ISOVUE-300) INJECTION 61% COMPARISON:  None. FINDINGS: Lower chest: The lung bases are clear without focal nodule, mass, or airspace disease. Heart size is normal. Hepatobiliary: No focal liver abnormality is seen. No gallstones, gallbladder wall thickening, or biliary dilatation. Pancreas: Unremarkable. No pancreatic ductal dilatation or surrounding inflammatory changes. Spleen: Normal in size without focal abnormality. Adrenals/Urinary Tract: The adrenal glands are normal. Kidneys and ureters are within normal limits. No focal mass lesion is present. There is no stone. The urinary bladder is  within normal limits. Stomach/Bowel: The proximal stomach is within normal limits. There is eccentric thickening and edema of the inferior medial duodenum. No discrete mass lesion is present. Small bowel is unremarkable. Contrast is  present throughout the colon without obstruction or mass. The ileocolic junction is normal. The appendix is visualized and within normal limits. The ascending and transverse colon are within normal limits. Descending colon is normal. Diverticular changes are present throughout the sigmoid colon. Focal inflammatory changes are present with some edema into the mesenteries. There is no focal fluid collection or free air. Vascular/Lymphatic: Aortic Atherosclerosis (ICD10-I70.0). There is no aneurysm or stenosis. Inflammatory nodes are present about the duodenum. No other significant adenopathy is present. Reproductive: Uterus and bilateral adnexa are unremarkable. Other: No abdominal wall hernia or abnormality. No abdominopelvic ascites. Musculoskeletal: Degenerative changes are noted in the facet joints bilaterally. Slight anterolisthesis is present at L5-S1. AP alignment is otherwise anatomic. IMPRESSION: 1. Sigmoid diverticulitis without complicating features. 2. Asymmetric thickening and edema of the duodenum is likely related to inflammation. Endoscopy would be useful to evaluate for inflammation versus neoplasm. Subcentimeter lymph nodes appear inflammatory. 3. Aortic atherosclerosis without aneurysm. These results were called by telephone at the time of interpretation on 10/28/2017 at 8:57 am to Dr. Ileana RoupJAMES MCSHANE , who verbally acknowledged these results. Electronically Signed   By: Marin Robertshristopher  Mattern M.D.   On: 10/28/2017 09:00     ASSESSMENT AND PLAN:   61 y/o female no PMHX here with abdominal pain and melena.  1. Sigmoid diverticulitis without complicating features: Continue CIPRO and F;agyl Colonoscopy pallned in 6-8 weeks as outpatient.  2. Melena: patient will  undergo EGD today Continue PPI Appreciate GI consult  3. Acute blood loss anemia" CBC for am No need for transfusion today     .       Management plans discussed with the patient and she is in agreement.  CODE STATUS: full  TOTAL TIME TAKING CARE OF THIS PATIENT: 30 minutes.     POSSIBLE D/C tomorrow, DEPENDING ON CLINICAL CONDITION.   Himani Corona M.D on 10/29/2017 at 12:39 PM  Between 7am to 6pm - Pager - [phone] After 6pm go to [email or web address] - password [email or web address]  [facility]  Office  [phone]  CC: Primary care physician; Patient, No Pcp Per  Note: This dictation was prepared with Dragon dictation along with smaller phrase technology. Any transcriptional errors that result from this process are unintentional.

## 2017-10-29 NOTE — Transfer of Care (Signed)
Immediate Anesthesia Transfer of Care Note  Patient: Bailey Dorsey  Procedure(s) Performed: ESOPHAGOGASTRODUODENOSCOPY (EGD) WITH PROPOFOL (N/A )  Patient Location: Endoscopy Unit  Anesthesia Type:General  Level of Consciousness: awake and alert   Airway & Oxygen Therapy: Patient Spontanous Breathing and Patient connected to nasal cannula oxygen  Post-op Assessment: Report given to RN and Post -op Vital signs reviewed and stable  Post vital signs: Reviewed and stable  Last Vitals:  Vitals:   10/29/17 1109 10/29/17 1158  BP: (!) 166/51 (!) (P) 89/45  Pulse: (!) 104   Resp: 18   Temp: 36.9 C (!) (P) 36 C  SpO2: 97%     Last Pain:  Vitals:   10/29/17 1158  TempSrc: (P) Tympanic  PainSc:       Patients Stated Pain Goal: 3 (10/29/17 [id number])  Complications: No apparent anesthesia complications

## 2017-10-29 NOTE — Op Note (Signed)
[facility] Patient Name: Bailey RiggerFrances Dorsey Procedure Date: 10/29/2017 11:38 AM MRN: [id number] Account #: [id number] Date of Birth: May 07, 1956 Admit Type: Inpatient Age: 61 [facility] ENDO ROOM 4 Gender: Female Note Status: Finalized Procedure:            Upper GI endoscopy Indications:          Melena Providers:            Midge Miniumarren Ulanda Tackett MD, MD Referring MD:         No Local Md, MD (Referring MD) Medicines:            Propofol per Anesthesia Complications:        No immediate complications. Procedure:            Pre-Anesthesia Assessment:                       - Prior to the procedure, a History and Physical was                        performed, and patient medications and allergies were                        reviewed. The patient's tolerance of previous                        anesthesia was also reviewed. The risks and benefits of                        the procedure and the sedation options and risks were                        discussed with the patient. All questions were                        answered, and informed consent was obtained. Prior                        Anticoagulants: The patient has taken no previous                        anticoagulant or antiplatelet agents. ASA Grade                        Assessment: II - A patient with mild systemic disease.                        After reviewing the risks and benefits, the patient was                        deemed in satisfactory condition to undergo the                        procedure.                       After obtaining informed consent, the endoscope was                        passed under direct vision. Throughout the procedure,  the patient's blood pressure, pulse, and oxygen                        saturations were monitored continuously. The                        Colonoscope was introduced through the mouth, and                        advanced to the  second part of duodenum. The upper GI                        endoscopy was accomplished without difficulty. The                        patient tolerated the procedure well. Findings:      The examined esophagus was normal.      Localized moderate inflammation characterized by erythema was found in       the gastric antrum.      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       This was traversed. A TTS dilator was passed through the scope. Dilation       with an 10-28-12 mm pyloric balloon dilator was performed. The dilation       site was examined following endoscope reinsertion and showed complete       resolution of luminal narrowing.      One non-bleeding cratered duodenal ulcer was found in the duodenal bulb. Impression:           - Normal esophagus.                       - Gastritis.                       - Gastric stenosis was found at the pylorus. Dilated.                       - One non-bleeding duodenal ulcer.                       - No specimens collected. Recommendation:       - Return patient to hospital ward for ongoing care. Procedure Code(s):    --- Professional ---                       [phone], Esophagogastroduodenoscopy, flexible, transoral;                        with dilation of gastric/duodenal stricture(s) (eg,                        balloon, bougie) Diagnosis Code(s):    --- Professional ---                       K92.1, Melena (includes Hematochezia)                       K26.9, Duodenal ulcer, unspecified as acute or chronic,                        without hemorrhage or perforation  K31.1, Adult hypertrophic pyloric stenosis CPT copyright 2016 American Medical Association. All rights reserved. The codes documented in this report are preliminary and upon coder review may  be revised to meet current compliance requirements. Midge Minium MD, MD 10/29/2017 11:56:23 AM This report has been signed  electronically. Number of Addenda: 0 Note Initiated On: 10/29/2017 11:38 AM      [facility]

## 2017-10-29 NOTE — Anesthesia Postprocedure Evaluation (Signed)
Anesthesia Post Note  Patient: Bailey RiggerFrances Dorsey  Procedure(s) Performed: ESOPHAGOGASTRODUODENOSCOPY (EGD) WITH PROPOFOL (N/A )  Patient location during evaluation: Endoscopy Anesthesia Type: General Level of consciousness: awake and alert and oriented Pain management: pain level controlled Vital Signs Assessment: post-procedure vital signs reviewed and stable Respiratory status: spontaneous breathing, nonlabored ventilation and respiratory function stable Cardiovascular status: blood pressure returned to baseline and stable Postop Assessment: no signs of nausea or vomiting Anesthetic complications: no     Last Vitals:  Vitals:   10/29/17 1218 10/29/17 1228  BP: (!) 103/51 (!) 98/41  Pulse: 86 84  Resp: 11 12  Temp:    SpO2: 100% 97%    Last Pain:  Vitals:   10/29/17 1158  TempSrc: Tympanic  PainSc:                  Jeriann Sayres

## 2017-10-29 NOTE — Anesthesia Preprocedure Evaluation (Signed)
Anesthesia Evaluation  Patient identified by MRN, date of birth, ID band Patient awake    Reviewed: Allergy & Precautions, NPO status , Patient's Chart, lab work & pertinent test results  History of Anesthesia Complications Negative for: history of anesthetic complications  Airway Mallampati: III  TM Distance: >3 FB Neck ROM: Full    Dental no notable dental hx.    Pulmonary neg sleep apnea, neg COPD, former smoker,    breath sounds clear to auscultation- rhonchi (-) wheezing      Cardiovascular Exercise Tolerance: Good (-) hypertension(-) CAD, (-) Past MI and (-) Cardiac Stents  Rhythm:Regular Rate:Normal - Systolic murmurs and - Diastolic murmurs    Neuro/Psych negative neurological ROS  negative psych ROS   GI/Hepatic Neg liver ROS, Possible GIB   Endo/Other  negative endocrine ROSneg diabetes  Renal/GU negative Renal ROS     Musculoskeletal negative musculoskeletal ROS (+)   Abdominal (+) - obese,   Peds  Hematology negative hematology ROS (+)   Anesthesia Other Findings   Reproductive/Obstetrics                             Anesthesia Physical Anesthesia Plan  ASA: II  Anesthesia Plan: General   Post-op Pain Management:    Induction: Intravenous  PONV Risk Score and Plan: 2 and Propofol infusion  Airway Management Planned: Natural Airway  Additional Equipment:   Intra-op Plan:   Post-operative Plan:   Informed Consent: I have reviewed the patients History and Physical, chart, labs and discussed the procedure including the risks, benefits and alternatives for the proposed anesthesia with the patient or authorized representative who has indicated his/her understanding and acceptance.   Dental advisory given  Plan Discussed with: CRNA and Anesthesiologist  Anesthesia Plan Comments:         Anesthesia Quick Evaluation

## 2017-10-30 ENCOUNTER — Encounter: Payer: Self-pay | Admitting: Gastroenterology

## 2017-10-30 LAB — CBC
HCT: 25.2 % — ABNORMAL LOW (ref 35.0–47.0)
Hemoglobin: 8.3 g/dL — ABNORMAL LOW (ref 12.0–16.0)
MCH: 30.8 pg (ref 26.0–34.0)
MCHC: 32.9 g/dL (ref 32.0–36.0)
MCV: 93.4 fL (ref 80.0–100.0)
Platelets: 251 10*3/uL (ref 150–440)
RBC: 2.7 MIL/uL — ABNORMAL LOW (ref 3.80–5.20)
RDW: 14.3 % (ref 11.5–14.5)
WBC: 7.1 10*3/uL (ref 3.6–11.0)

## 2017-10-30 MED ORDER — PANTOPRAZOLE SODIUM 40 MG PO TBEC: 40.0000 mg | DELAYED_RELEASE_TABLET | Freq: Every day | ORAL | 0 refills | Status: DC

## 2017-10-30 MED ORDER — METRONIDAZOLE 500 MG PO TABS: 500.0000 mg | ORAL_TABLET | Freq: Three times a day (TID) | ORAL | 0 refills | Status: AC

## 2017-10-30 MED ORDER — FERROUS SULFATE 325 (65 FE) MG PO TABS
325.0000 mg | ORAL_TABLET | Freq: Every day | ORAL | 3 refills | Status: DC
Start: 2017-10-30 — End: 2022-10-16

## 2017-10-30 MED ORDER — CIPROFLOXACIN HCL 500 MG PO TABS: 500.0000 mg | ORAL_TABLET | Freq: Two times a day (BID) | ORAL | 0 refills | Status: AC

## 2017-10-30 NOTE — Progress Notes (Signed)
[facility] [place] [facility]        Bailey Dorsey was admitted to the Hospital on 10/28/2017 and Discharged  10/30/2017 and should be excused from work/school   for 7  days starting 10/28/2017 , may return to work/school without any restrictions.  Call Adrian SaranSital Lorimer Tiberio MD with questions.  [name] M.D on 10/30/2017,at 10:23 AM  [name] [place] [facility]    Office  [phone]

## 2017-10-30 NOTE — Discharge Summary (Signed)
[facility] -  at [facility]   PATIENT NAME: Bailey Dorsey    MR#:  [id number]  DATE OF BIRTH:  1956-08-07  DATE OF ADMISSION:  10/28/2017 ADMITTING PHYSICIAN: Ramonita LabAruna Gouru, MD  DATE OF DISCHARGE: 10/30/2017  PRIMARY CARE PHYSICIAN: Patient, No Pcp Per    ADMISSION DIAGNOSIS:  Diverticulitis [K57.92] Abdominal pain, unspecified abdominal location [R10.9]  DISCHARGE DIAGNOSIS:  Active Problems:   Acute abdominal pain   Diverticulitis   Upper GI bleed   Blood in stool   Postpyloric ulcer   SECONDARY DIAGNOSIS:  History reviewed. No pertinent past medical history.  HOSPITAL COURSE:   61 y/o female no PMHX here with abdominal pain and melena.  1. Sigmoid diverticulitis without complicating features: Continue CIPRO and Flagyl for 10 more days. He overt and/or probiotics was encouraged as well. Patient was also evaluated by dietitian prior to discharge. Colonoscopy planned in 6 weeks.  2. 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 She will continue avoiding NSAIDs and BC powder. She will start Protonix 40 mg daily.  3. Acute blood loss anemia is iron deficiency   anemia panel noted to have low iron. Patient did not require blood transfusion during hospital stay. She will be discharged with iron sulfate and hematology follow-up  DISCHARGE CONDITIONS AND DIET:   Stable for discharge on soft diet  CONSULTS OBTAINED:  Treatment Team:  Midge MiniumWohl, Darren, MD  DRUG ALLERGIES:   Allergies  Allergen Reactions  . Amoxicillin-Pot Clavulanate Nausea And Vomiting    DISCHARGE MEDICATIONS:   Current Discharge Medication List    START taking these medications   Details  ferrous sulfate (FERROUSUL) 325 (65 FE) MG tablet Take 1 tablet (325 mg total) daily with breakfast by mouth. Qty: 30 tablet, Refills: 3    metroNIDAZOLE (FLAGYL) 500 MG tablet Take 1  tablet (500 mg total) 3 (three) times daily for 10 days by mouth. Qty: 30 tablet, Refills: 0    pantoprazole (PROTONIX) 40 MG tablet Take 1 tablet (40 mg total) daily by mouth. Qty: 30 tablet, Refills: 0      CONTINUE these medications which have CHANGED   Details  ciprofloxacin (CIPRO) 500 MG tablet Take 1 tablet (500 mg total) 2 (two) times daily for 10 days by mouth. Qty: 20 tablet, Refills: 0      CONTINUE these medications which have NOT CHANGED   Details  albuterol (PROAIR HFA) 108 (90 Base) MCG/ACT inhaler Inhale 2 puffs every 6 (six) hours as needed into the lungs for wheezing.    cetirizine (ZYRTEC) 10 MG tablet Take 10 mg daily as needed by mouth for allergies.      STOP taking these medications     omeprazole (PRILOSEC OTC) 20 MG tablet           Today   CHIEF COMPLAINT:  Patient doing well this morning. Denies abdominal pain or dark colored stools.   VITAL SIGNS:  Blood pressure (!) 100/52, pulse 92, temperature 98.5 F (36.9 C), temperature source Oral, resp. rate 18, height 5' (1.524 m), weight 64.4 kg (142 lb), SpO2 98 %.   REVIEW OF SYSTEMS:  Review of Systems  Constitutional: Negative.  Negative for chills, fever and malaise/fatigue.  HENT: Negative.  Negative for ear discharge, ear pain, hearing loss, nosebleeds and sore throat.   Eyes: Negative.  Negative for blurred vision and pain.  Respiratory: Negative.  Negative for cough, hemoptysis, shortness of breath and wheezing.  Cardiovascular: Negative.  Negative for chest pain, palpitations and leg swelling.  Gastrointestinal: Negative.  Negative for abdominal pain, blood in stool, diarrhea, nausea and vomiting.  Genitourinary: Negative.  Negative for dysuria.  Musculoskeletal: Negative.  Negative for back pain.  Skin: Negative.   Neurological: Negative for dizziness, tremors, speech change, focal weakness, seizures and headaches.  Endo/Heme/Allergies: Negative.  Does not bruise/bleed easily.   Psychiatric/Behavioral: Negative.  Negative for depression, hallucinations and suicidal ideas.     PHYSICAL EXAMINATION:  GENERAL:  61 y.o.-year-old patient lying in the bed with no acute distress.  NECK:  Supple, no jugular venous distention. No thyroid enlargement, no tenderness.  LUNGS: Normal breath sounds bilaterally, no wheezing, rales,rhonchi  No use of accessory muscles of respiration.  CARDIOVASCULAR: S1, S2 normal. No murmurs, rubs, or gallops.  ABDOMEN: Soft, non-tender, non-distended. Bowel sounds present. No organomegaly or mass.  EXTREMITIES: No pedal edema, cyanosis, or clubbing.  PSYCHIATRIC: The patient is alert and oriented x 3.  SKIN: No obvious rash, lesion, or ulcer.   DATA REVIEW:   CBC Recent Labs  Lab 10/30/17 0438  WBC 7.1  HGB 8.3*  HCT 25.2*  PLT 251    Chemistries  Recent Labs  Lab 10/29/17 0423  NA 138  K 3.5  CL 107  CO2 25  GLUCOSE 92  BUN 8  CREATININE 0.50  CALCIUM 7.9*  AST 20  ALT 13*  ALKPHOS 40  BILITOT 0.5    Cardiac Enzymes No results for input(s): TROPONINI in the last 168 hours.  Microbiology Results  @MICRORSLT48 @  RADIOLOGY:  Ct Abdomen Pelvis W Contrast  Result Date: 10/28/2017 CLINICAL DATA:  Vomiting scratched abdominal pain, unspecified. Umbilical pain for 4 days. Vomiting. Black stools. Weight gain. EXAM: CT ABDOMEN AND PELVIS WITH CONTRAST TECHNIQUE: Multidetector CT imaging of the abdomen and pelvis was performed using the standard protocol following bolus administration of intravenous contrast. CONTRAST:  30mL ISOVUE-300 IOPAMIDOL (ISOVUE-300) INJECTION 61%, ISOVUE-300 IOPAMIDOL (ISOVUE-300) INJECTION 61% COMPARISON:  None. FINDINGS: Lower chest: The lung bases are clear without focal nodule, mass, or airspace disease. Heart size is normal. Hepatobiliary: No focal liver abnormality is seen. No gallstones, gallbladder wall thickening, or biliary dilatation. Pancreas: Unremarkable. No pancreatic ductal  dilatation or surrounding inflammatory changes. Spleen: Normal in size without focal abnormality. Adrenals/Urinary Tract: The adrenal glands are normal. Kidneys and ureters are within normal limits. No focal mass lesion is present. There is no stone. The urinary bladder is within normal limits. Stomach/Bowel: The proximal stomach is within normal limits. There is eccentric thickening and edema of the inferior medial duodenum. No discrete mass lesion is present. Small bowel is unremarkable. Contrast is present throughout the colon without obstruction or mass. The ileocolic junction is normal. The appendix is visualized and within normal limits. The ascending and transverse colon are within normal limits. Descending colon is normal. Diverticular changes are present throughout the sigmoid colon. Focal inflammatory changes are present with some edema into the mesenteries. There is no focal fluid collection or free air. Vascular/Lymphatic: Aortic Atherosclerosis (ICD10-I70.0). There is no aneurysm or stenosis. Inflammatory nodes are present about the duodenum. No other significant adenopathy is present. Reproductive: Uterus and bilateral adnexa are unremarkable. Other: No abdominal wall hernia or abnormality. No abdominopelvic ascites. Musculoskeletal: Degenerative changes are noted in the facet joints bilaterally. Slight anterolisthesis is present at L5-S1. AP alignment is otherwise anatomic. IMPRESSION: 1. Sigmoid diverticulitis without complicating features. 2. Asymmetric thickening and edema of the duodenum is likely related to inflammation.  Endoscopy would be useful to evaluate for inflammation versus neoplasm. Subcentimeter lymph nodes appear inflammatory. 3. Aortic atherosclerosis without aneurysm. These results were called by telephone at the time of interpretation on 10/28/2017 at 8:57 am to Dr. Ileana RoupJAMES MCSHANE , who verbally acknowledged these results. Electronically Signed   By: Marin Robertshristopher  Mattern M.D.   On:  10/28/2017 09:00      Current Discharge Medication List    START taking these medications   Details  ferrous sulfate (FERROUSUL) 325 (65 FE) MG tablet Take 1 tablet (325 mg total) daily with breakfast by mouth. Qty: 30 tablet, Refills: 3    metroNIDAZOLE (FLAGYL) 500 MG tablet Take 1 tablet (500 mg total) 3 (three) times daily for 10 days by mouth. Qty: 30 tablet, Refills: 0    pantoprazole (PROTONIX) 40 MG tablet Take 1 tablet (40 mg total) daily by mouth. Qty: 30 tablet, Refills: 0      CONTINUE these medications which have CHANGED   Details  ciprofloxacin (CIPRO) 500 MG tablet Take 1 tablet (500 mg total) 2 (two) times daily for 10 days by mouth. Qty: 20 tablet, Refills: 0      CONTINUE these medications which have NOT CHANGED   Details  albuterol (PROAIR HFA) 108 (90 Base) MCG/ACT inhaler Inhale 2 puffs every 6 (six) hours as needed into the lungs for wheezing.    cetirizine (ZYRTEC) 10 MG tablet Take 10 mg daily as needed by mouth for allergies.      STOP taking these medications     omeprazole (PRILOSEC OTC) 20 MG tablet           Management plans discussed with the patient and she is in agreement. Stable for discharge home  Patient should follow up with pcp  CODE STATUS:     Code Status Orders  (From admission, onward)        Start     Ordered   10/28/17 1039  Full code  Continuous     10/28/17 1038    Code Status History    Date Active Date Inactive Code Status Order ID Comments User Context   This patient has a current code status but no historical code status.      TOTAL TIME TAKING CARE OF THIS PATIENT: 37 minutes.    Note: This dictation was prepared with Dragon dictation along with smaller phrase technology. Any transcriptional errors that result from this process are unintentional.  Endia Moncur M.D on 10/30/2017 at 7:52 AM  Between 7am to 6pm - Pager - [phone] After 6pm go to [email or web address] - password [email or web address]  [facility]  Office  [phone]  CC: Primary care physician; Patient, No Pcp Per

## 2017-10-30 NOTE — Progress Notes (Signed)
Pt was instructed to finish all antibiotics and I educated her on her diet for Diverticulitis. Her VS were WDL and she expressed that she understood and will comply. Pt given her Dr's note for work and she was released to her sister.

## 2017-10-30 NOTE — Progress Notes (Signed)
Nutrition Education Note  RD consulted for nutrition education regarding a low fiber diet for diverticulitis.  RD provided "Nutrition and Diverticulitis" handout with supporting information. Reviewed patient's dietary recall. Provided examples of low and high fiber foods. Discouraged intake of high fiber foods, high fat foods, spicy foods, processed foods, caffeine and red meats when having a flare. Encouraged pt to cook foods until they are soft and chew foods well to help aid in digestion. Also recommend frequent small meals. Encouraged use of a multi-vitamin and protein supplements while having a flare.    RD encouraged intake of high fiber foods when not having a flare.   Expect good compliance.  Body mass index is 27.73 kg/m. Pt meets criteria for overweight based on current BMI.  Current diet order is soft, patient is consuming approximately 100% of meals at this time. Labs and medications reviewed. No further nutrition interventions warranted at this time. RD contact information provided. If additional nutrition issues arise, please re-consult RD.  Betsey Holidayasey Shaqueta Casady MS, RD, LDN Pager #- [phone] After Hours Pager: [phone]

## 2017-11-11 ENCOUNTER — Telehealth: Payer: Self-pay | Admitting: Gastroenterology

## 2017-11-11 NOTE — Telephone Encounter (Signed)
Patient LVM and wanting to know what kinds of foods she can eat?

## 2017-11-12 ENCOUNTER — Other Ambulatory Visit: Payer: Self-pay

## 2017-11-12 NOTE — Telephone Encounter (Signed)
Pt had questions regarding what foods she can eat due to recent gastric ulcer seen on EGD. Discuss eating habits with pt and what foods to avoid.

## 2017-12-03 ENCOUNTER — Other Ambulatory Visit: Payer: Self-pay

## 2017-12-03 ENCOUNTER — Telehealth: Payer: Self-pay | Admitting: Gastroenterology

## 2017-12-03 MED ORDER — PANTOPRAZOLE SODIUM 40 MG PO TBEC: 40.0000 mg | DELAYED_RELEASE_TABLET | Freq: Every day | ORAL | 11 refills | Status: DC

## 2017-12-03 NOTE — Telephone Encounter (Signed)
Patient is doing better and canceled appt. She does need to talk to you regarding the medication. Please call

## 2017-12-04 ENCOUNTER — Ambulatory Visit: Payer: Commercial Managed Care - PPO | Admitting: Gastroenterology

## 2017-12-04 NOTE — Telephone Encounter (Signed)
Pt had questions about how long she needed to take the protonix medication. Pt also requested a copy of her procedure report.

## 2018-03-24 DIAGNOSIS — D509 Iron deficiency anemia, unspecified: Secondary | ICD-10-CM | POA: Insufficient documentation

## 2018-03-24 DIAGNOSIS — Z8719 Personal history of other diseases of the digestive system: Secondary | ICD-10-CM | POA: Insufficient documentation

## 2019-01-29 IMAGING — CT CT ABD-PELV W/ CM
2 of 5 series · 16 of 46 positions shown, 18 images · IV contrast (APPLIED)
Comparison: None.

CLINICAL DATA: Vomiting scratched abdominal pain, unspecified.
Umbilical pain for 4 days. Vomiting. Black stools. Weight gain.

EXAM:
CT ABDOMEN AND PELVIS WITH CONTRAST
TECHNIQUE: Multidetector CT imaging of the abdomen and pelvis was performed
using the standard protocol following bolus administration of
intravenous contrast.
CONTRAST:  30mL I4N18T-HII IOPAMIDOL (I4N18T-HII) INJECTION 61%,
100mL I4N18T-HII IOPAMIDOL (I4N18T-HII) INJECTION 61%

[Series 2: routine abd/pel with · axial · 0.75mm/px · z∈[-443,-78]mm · 13 of 83 slices shown, 15 images]
[im 5/83  soft-tissue]
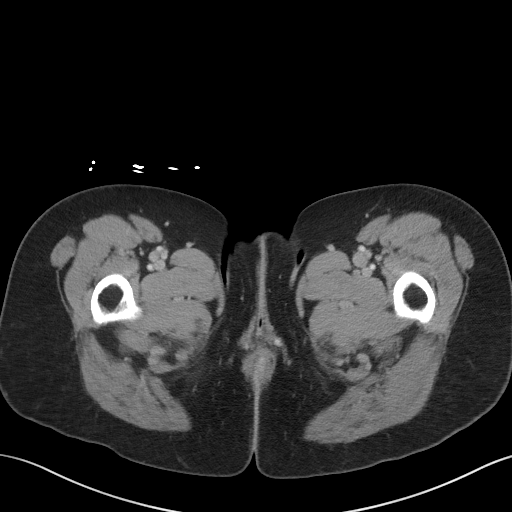
[im 5/83  bone]
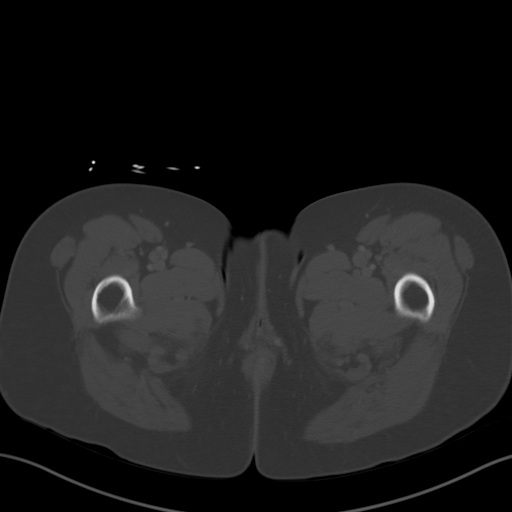
[im 10/83  soft-tissue]
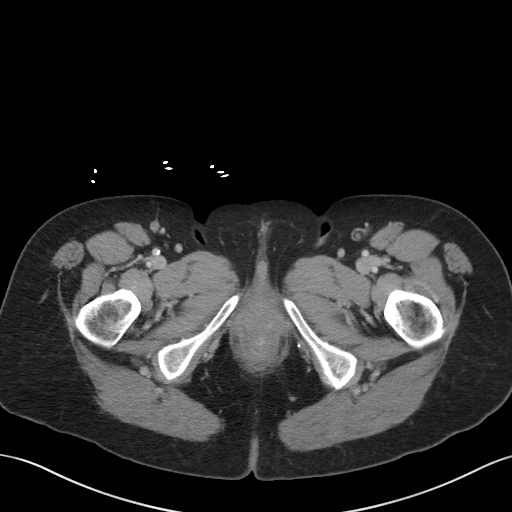
[im 19/83  soft-tissue]
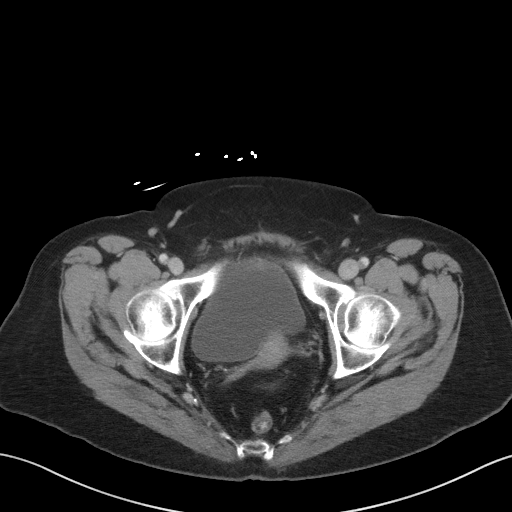
[im 23/83  soft-tissue]
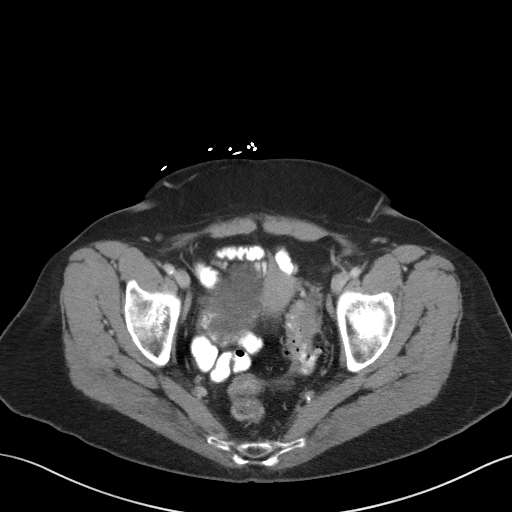
[im 28/83  soft-tissue]
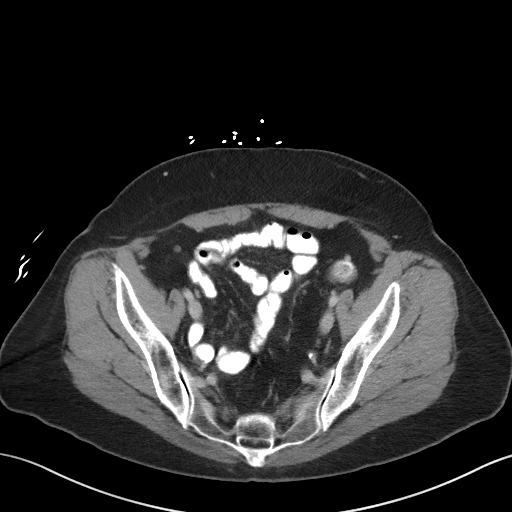
[im 37/83  soft-tissue]
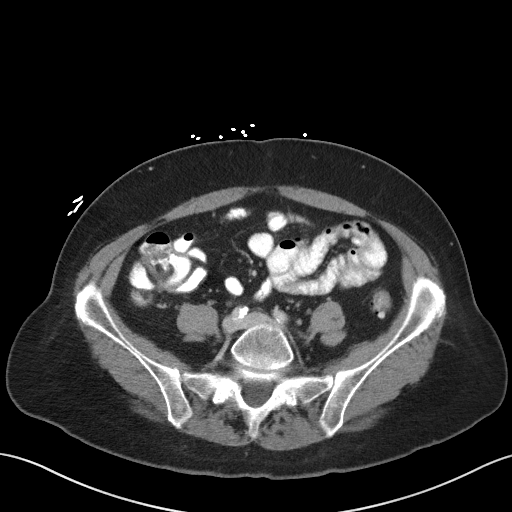
[im 42/83  soft-tissue]
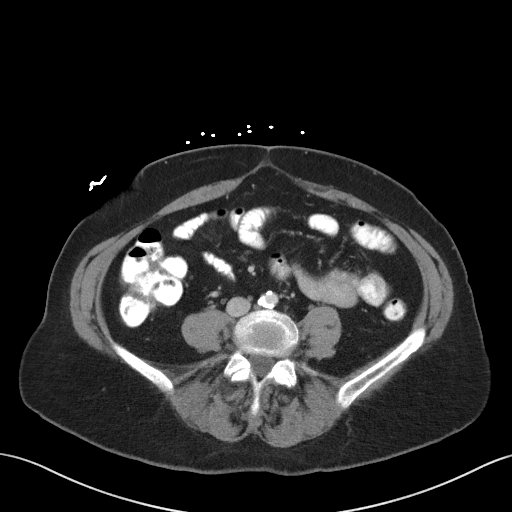
[im 46/83  soft-tissue]
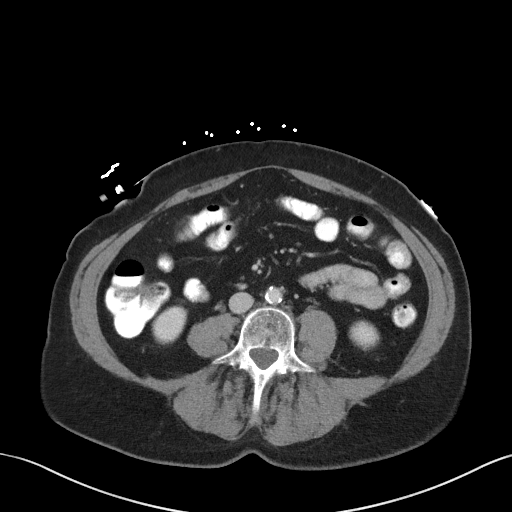
[im 55/83  soft-tissue]
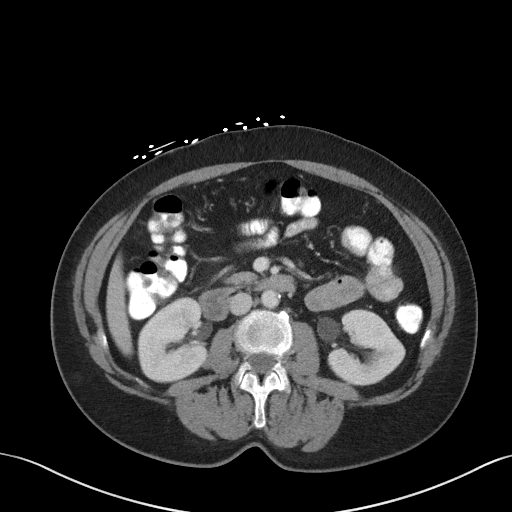
[im 55/83  bone]
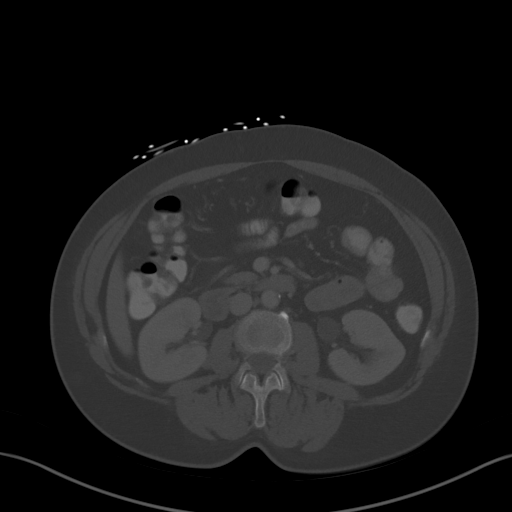
[im 60/83  soft-tissue]
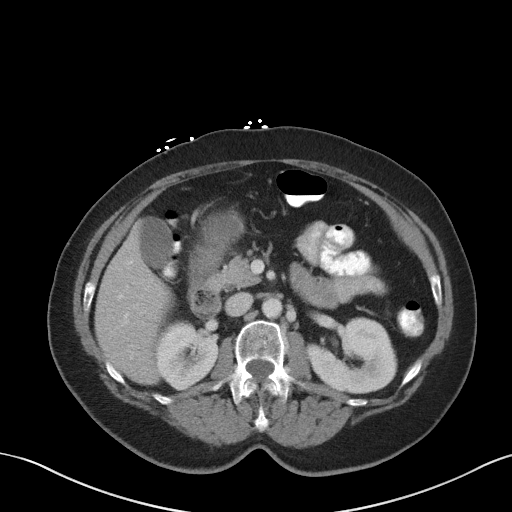
[im 64/83  soft-tissue]
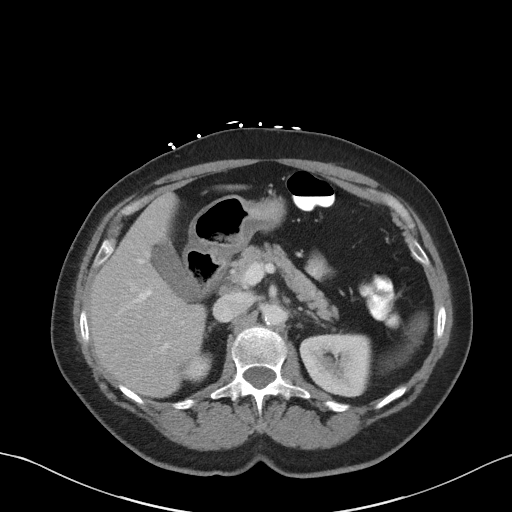
[im 73/83  soft-tissue]
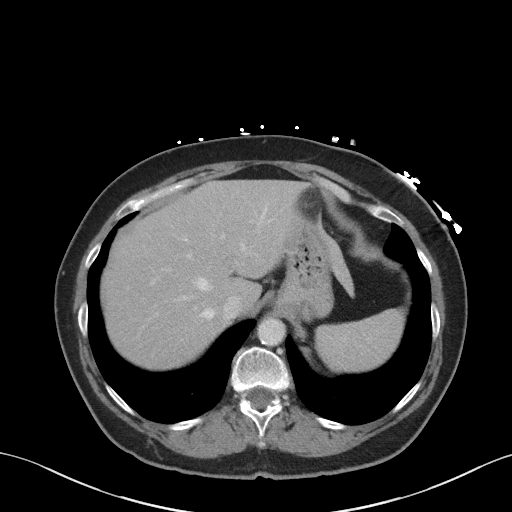
[im 78/83  soft-tissue]
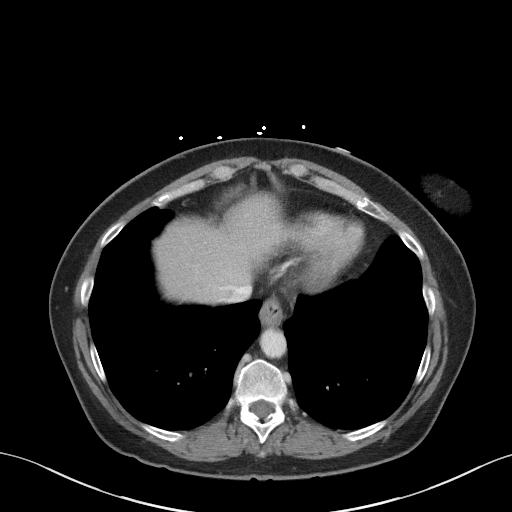

[Series 5: coronal st · coronal · 0.74mm/px · 3 of 84 slices shown]
[im 28/84  soft-tissue]
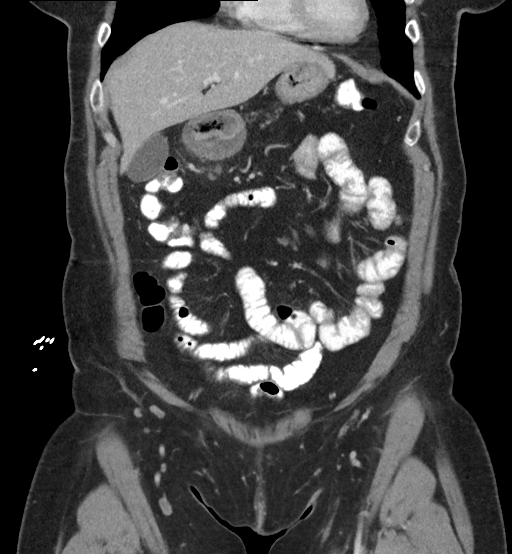
[im 37/84  soft-tissue]
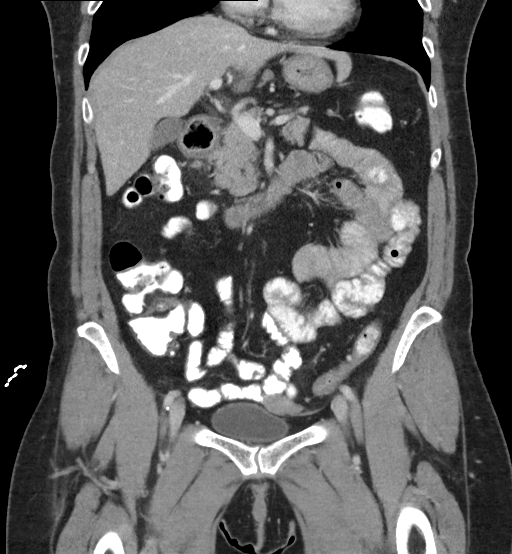
[im 47/84  soft-tissue]
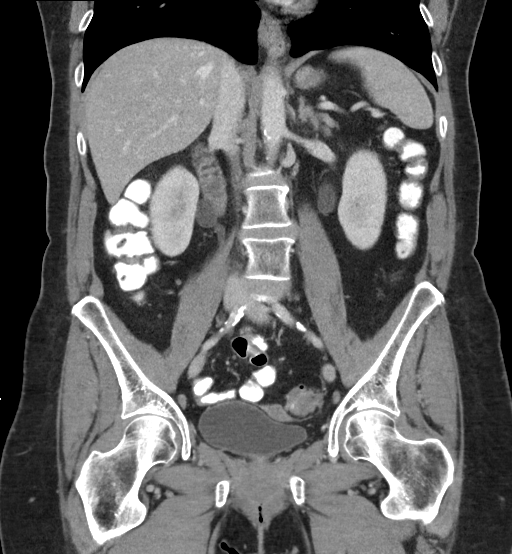

[16 of 46 positions shown; findings below may reference images not displayed]

FINDINGS: Lower chest: The lung bases are clear without focal nodule, mass, or
airspace disease. Heart size is normal.

Hepatobiliary: No focal liver abnormality is seen. No gallstones,
gallbladder wall thickening, or biliary dilatation.

Pancreas: Unremarkable. No pancreatic ductal dilatation or
surrounding inflammatory changes.

Spleen: Normal in size without focal abnormality.

Adrenals/Urinary Tract: The adrenal glands are normal. Kidneys and
ureters are within normal limits. No focal mass lesion is present.
There is no stone. The urinary bladder is within normal limits.

Stomach/Bowel: The proximal stomach is within normal limits. There
is eccentric thickening and edema of the inferior medial duodenum.
No discrete mass lesion is present. Small bowel is unremarkable.
Contrast is present throughout the colon without obstruction or
mass. The ileocolic junction is normal. The appendix is visualized
and within normal limits. The ascending and transverse colon are
within normal limits. Descending colon is normal. Diverticular
changes are present throughout the sigmoid colon. Focal inflammatory
changes are present with some edema into the mesenteries. There is
no focal fluid collection or free air.

Vascular/Lymphatic: Aortic Atherosclerosis ([id number]). There is
no aneurysm or stenosis. Inflammatory nodes are present about the
duodenum. No other significant adenopathy is present.

Reproductive: Uterus and bilateral adnexa are unremarkable.

Other: No abdominal wall hernia or abnormality. No abdominopelvic
ascites.

Musculoskeletal: Degenerative changes are noted in the facet joints
bilaterally. Slight anterolisthesis is present at L5-S1. AP
alignment is otherwise anatomic.
IMPRESSION: 1. Sigmoid diverticulitis without complicating features.
2. Asymmetric thickening and edema of the duodenum is likely related
to inflammation. Endoscopy would be useful to evaluate for
inflammation versus neoplasm. Subcentimeter lymph nodes appear
inflammatory.
3. Aortic atherosclerosis without aneurysm.
These results were called by telephone at the time of interpretation
on 10/28/2017 at [DATE] to Dr. KHUMOMATLHARE YAFUFUREGA ZAUANA , who verbally
acknowledged these results.

## 2019-03-10 ENCOUNTER — Telehealth: Payer: Self-pay | Admitting: Gastroenterology

## 2019-03-10 NOTE — Telephone Encounter (Signed)
Spoke with pt and she thinks she may be getting a diverticulitis flare. She stated it started Sunday and is beginning to get tender in her LLQ when she presses on the area. She did a telemedicine visit yesterday and she was only instructed to take Tylenol. Her stools are become more frequent but not yet diarrhea. No fever or chills noted. Pt is requesting a round of antibiotics. She is allergic to Amoxicillin. Please advise.

## 2019-03-10 NOTE — Telephone Encounter (Signed)
It appears this patient had her last colonoscopy by Dr. Lemar Livings and had a bout of diverticulitis back in November 2018.  At the time of his being consult she had black stools and had an upper endoscopy.  The patient was recommended to have a repeat colonoscopy in follow-up but did not.  She has recently been seen in January by Dr. Terance Hart her primary care provider and should contact him for treatment of her diverticulitis since I had only seen her in the hospital and she did not follow-up as directed.

## 2019-03-10 NOTE — Telephone Encounter (Signed)
Pt is calling to speak with Ginger regarding her diverticulitis cb [phone]

## 2019-03-10 NOTE — Telephone Encounter (Signed)
Pt has been notified to contact her PCP for treatment of her possible diverticulitis

## 2020-08-02 DIAGNOSIS — K921 Melena: Secondary | ICD-10-CM | POA: Insufficient documentation

## 2022-03-28 ENCOUNTER — Encounter: Payer: Self-pay | Admitting: Podiatry

## 2022-03-28 ENCOUNTER — Ambulatory Visit: Payer: Commercial Managed Care - PPO | Admitting: Podiatry

## 2022-03-28 ENCOUNTER — Other Ambulatory Visit (INDEPENDENT_AMBULATORY_CARE_PROVIDER_SITE_OTHER): Payer: Self-pay | Admitting: Nurse Practitioner

## 2022-03-28 DIAGNOSIS — L603 Nail dystrophy: Secondary | ICD-10-CM | POA: Diagnosis not present

## 2022-03-28 DIAGNOSIS — I781 Nevus, non-neoplastic: Secondary | ICD-10-CM

## 2022-03-28 DIAGNOSIS — N8000 Endometriosis of the uterus, unspecified: Secondary | ICD-10-CM | POA: Insufficient documentation

## 2022-03-28 DIAGNOSIS — M79604 Pain in right leg: Secondary | ICD-10-CM

## 2022-03-28 NOTE — Progress Notes (Signed)
?  Subjective:  ?Patient ID: Bailey Dorsey, female    DOB: 1956/10/09,  MRN: [id number] ?HPI ?Chief Complaint  ?Patient presents with  ? Nail Problem  ?  Hallux left - thick and discolored nail x several months, 5th toe a little thick as well, tries to keep filed down, tender sometimes  ? New Patient (Initial Visit)  ? ? ?66 y.o. female presents with the above complaint.  ? ?ROS: Denies fever chills nausea vomiting muscle aches pains calf pain back pain chest pain shortness of breath. ? ?No past medical history on file. ?Past Surgical History:  ?Procedure Laterality Date  ? ESOPHAGOGASTRODUODENOSCOPY (EGD) WITH PROPOFOL N/A 10/29/2017  ? Procedure: ESOPHAGOGASTRODUODENOSCOPY (EGD) WITH PROPOFOL;  Surgeon: Midge Minium, MD;  Location[facility] ENDOSCOPY;  Service: Endoscopy;  Laterality: N/A;  ? ? ?Current Outpatient Medications:  ?  albuterol (PROAIR HFA) 108 (90 Base) MCG/ACT inhaler, Inhale 2 puffs every 6 (six) hours as needed into the lungs for wheezing., Disp: , Rfl:  ?  atorvastatin (LIPITOR) 20 MG tablet, Take 20 mg by mouth daily., Disp: , Rfl:  ?  estradiol (ESTRACE) 0.1 MG/GM vaginal cream, SMARTSIG:0.5 Gram(s) Vaginal Daily, Disp: , Rfl:  ?  ferrous sulfate (FERROUSUL) 325 (65 FE) MG tablet, Take 1 tablet (325 mg total) daily with breakfast by mouth., Disp: 30 tablet, Rfl: 3 ?  montelukast (SINGULAIR) 10 MG tablet, Take 10 mg by mouth at bedtime., Disp: , Rfl:  ? ?Allergies  ?Allergen Reactions  ? Amoxicillin-Pot Clavulanate Nausea And Vomiting  ? ?Review of Systems ?Objective:  ?There were no vitals filed for this visit. ? ?General: Well developed, nourished, in no acute distress, alert and oriented x3  ? ?Dermatological: Skin is warm, dry and supple bilateral. Nails x 10 are well maintained; remaining integument appears unremarkable at this time. There are no open sores, no preulcerative lesions, no rash or signs of infection present.  Hallux nail and fifth digit nail plate left demonstrates darkening  distal discoloration and subungual separation from the nail bed or onycholysis. ? ?Vascular: Dorsalis Pedis artery and Posterior Tibial artery pedal pulses are 2/4 bilateral with immedate capillary fill time. Pedal hair growth present. No varicosities and no lower extremity edema present bilateral.  ? ?Neruologic: Grossly intact via light touch bilateral. Vibratory intact via tuning fork bilateral. Protective threshold with Semmes Wienstein monofilament intact to all pedal sites bilateral. Patellar and Achilles deep tendon reflexes 2+ bilateral. No Babinski or clonus noted bilateral.  ? ?Musculoskeletal: No gross boney pedal deformities bilateral. No pain, crepitus, or limitation noted with foot and ankle range of motion bilateral. Muscular strength 5/5 in all groups tested bilateral. ? ?Gait: Unassisted, Nonantalgic.  ? ? ?Radiographs: ? ?None taken ? ?Assessment & Plan:  ? 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 ?Plan: Samples of the skin and nail plates were taken today for pathologic evaluation.  Follow-up with her in 1 month for discussion.  I did inform her that we may be able to take the nail plate off the same day that she is there if she needs to have that done. ? ? ? ? ?[place] Boody [place], DPM ?

## 2022-04-02 ENCOUNTER — Ambulatory Visit (INDEPENDENT_AMBULATORY_CARE_PROVIDER_SITE_OTHER): Payer: Commercial Managed Care - PPO | Admitting: Vascular Surgery

## 2022-04-02 ENCOUNTER — Encounter (INDEPENDENT_AMBULATORY_CARE_PROVIDER_SITE_OTHER): Payer: Self-pay | Admitting: Vascular Surgery

## 2022-04-02 ENCOUNTER — Ambulatory Visit (INDEPENDENT_AMBULATORY_CARE_PROVIDER_SITE_OTHER): Payer: Commercial Managed Care - PPO

## 2022-04-02 DIAGNOSIS — I872 Venous insufficiency (chronic) (peripheral): Secondary | ICD-10-CM

## 2022-04-02 DIAGNOSIS — M79604 Pain in right leg: Secondary | ICD-10-CM | POA: Diagnosis not present

## 2022-04-02 DIAGNOSIS — I781 Nevus, non-neoplastic: Secondary | ICD-10-CM | POA: Diagnosis not present

## 2022-04-02 DIAGNOSIS — I83819 Varicose veins of unspecified lower extremities with pain: Secondary | ICD-10-CM | POA: Diagnosis not present

## 2022-04-02 NOTE — Progress Notes (Signed)
? ? ? ? ?MRN : [id number] ? ?Bailey Dorsey is a 66 y.o. (Jan 15, 1956) female who presents with chief complaint of varicose veins hurt. ? ?History of Present Illness:  ?The patient is seen for evaluation of symptomatic varicose veins of the right lower extremity. The patient relates burning and stinging which worsened steadily throughout the course of the day, particularly with standing. The patient also notes an aching and throbbing pain over the varicosities of the right knee area, particularly with prolonged dependent positions. The symptoms are significantly improved with elevation.  The patient states the pain from the varicose veins interferes with work, daily exercise, shopping and household maintenance. At this point, the symptoms are persistent and severe enough that they're having a negative impact on lifestyle and are interfering with daily activities. ? ?There is no history of DVT, PE or superficial thrombophlebitis. ?There is no history of ulceration or hemorrhage. ?The patient denies a significant family history of varicose veins. ? ?The patient has worn graduated compression in the past with minimal benefit. At the present time the patient has not been using over-the-counter analgesics. There is no history of prior surgical intervention or sclerotherapy. ?  ?Duplex ultrasound of the right lower extremity shows a normal deep venous system, no reflux identified in the superficial system. ? ?Current Meds  ?Medication Sig  ? albuterol (VENTOLIN HFA) 108 (90 Base) MCG/ACT inhaler Inhale 2 puffs every 6 (six) hours as needed into the lungs for wheezing.  ? atorvastatin (LIPITOR) 20 MG tablet Take 20 mg by mouth daily.  ? estradiol (ESTRACE) 0.1 MG/GM vaginal cream SMARTSIG:0.5 Gram(s) Vaginal Daily  ? ferrous sulfate (FERROUSUL) 325 (65 FE) MG tablet Take 1 tablet (325 mg total) daily with breakfast by mouth.  ? montelukast (SINGULAIR) 10 MG tablet Take 10 mg by mouth at bedtime.  ? ? ?No past medical  history on file. ? ?Past Surgical History:  ?Procedure Laterality Date  ? ESOPHAGOGASTRODUODENOSCOPY (EGD) WITH PROPOFOL N/A 10/29/2017  ? Procedure: ESOPHAGOGASTRODUODENOSCOPY (EGD) WITH PROPOFOL;  Surgeon: Midge MiniumWohl, Darren, MD;  Location[facility] ENDOSCOPY;  Service: Endoscopy;  Laterality: N/A;  ? ? ?Social History ?Social History  ? ?Tobacco Use  ? Smoking status: Former  ?  Types: Cigarettes  ?  Quit date: 2017  ?  Years since quitting: 6.2  ? Smokeless tobacco: Never  ?Substance Use Topics  ? Alcohol use: No  ? Drug use: No  ? ? ?Family History ?Family History  ?Problem Relation Age of Onset  ? Heart Problems Mother   ? Renal Disease Mother   ? Heart attack Father   ? Diabetes Sister   ? ? ?Allergies  ?Allergen Reactions  ? Amoxicillin-Pot Clavulanate Nausea And Vomiting  ? ? ? ?REVIEW OF SYSTEMS (Negative unless checked) ? ?Constitutional: [] Weight loss  [] Fever  [] Chills ?Cardiac: [] Chest pain   [] Chest pressure   [] Palpitations   [] Shortness of breath when laying flat   [] Shortness of breath with exertion. ?Vascular:  [] Pain in legs with walking   [x] Pain in legs with standing  [] History of DVT   [] Phlebitis   [] Swelling in legs   [x] Varicose veins   [] Non-healing ulcers ?Pulmonary:   [] Uses home oxygen   [] Productive cough   [] Hemoptysis   [] Wheeze  [] COPD   [] Asthma ?Neurologic:  [] Dizziness   [] Seizures   [] History of stroke   [] History of TIA  [] Aphasia   [] Vissual changes   [] Weakness or numbness in arm   [] Weakness or numbness in leg ?Musculoskeletal:   []   Joint swelling   [] Joint pain   [] Low back pain ?Hematologic:  [] Easy bruising  [] Easy bleeding   [] Hypercoagulable state   [] Anemic ?Gastrointestinal:  [] Diarrhea   [] Vomiting  [] Gastroesophageal reflux/heartburn   [] Difficulty swallowing. ?Genitourinary:  [] Chronic kidney disease   [] Difficult urination  [] Frequent urination   [] Blood in urine ?Skin:  [] Rashes   [] Ulcers  ?Psychological:  [] History of anxiety   []  History of major depression. ? ?Physical  Examination ? ?Vitals:  ? 04/02/22 0900  ?BP: (!) 160/80  ?Pulse: 88  ?Resp: 16  ?Weight: 148 lb (67.1 kg)  ?Height: 4\' 11"  (1.499 m)  ? ?Body mass index is 29.89 kg/m?. ?Gen: WD/WN, NAD ?Head: [place]/AT, No temporalis wasting.  ?Ear/Nose/Throat: Hearing grossly intact, nares w/o erythema or drainage, pinna without lesions ?Eyes: PER, EOMI, sclera nonicteric.  ?Neck: Supple, no gross masses.  No JVD.  ?Pulmonary:  Good air movement, no audible wheezing, no use of accessory muscles.  ?Cardiac: RRR, precordium not hyperdynamic. ?Vascular:  Large varicosities present, 4 mm right shin and knee.  Veins are tender to palpation  Mild venous stasis changes to the legs bilaterally.  Trace soft pitting edema  ?Vessel Right Left  ?Radial Palpable Palpable  ?Gastrointestinal: soft, non-distended. No guarding/no peritoneal signs.  ?Musculoskeletal: M/S 5/5 throughout.  No deformity.  ?Neurologic: CN 2-12 intact. Pain and light touch intact in extremities.  Symmetrical.  Speech is fluent. Motor exam as listed above. ?Psychiatric: Judgment intact, Mood & affect appropriate for pt's clinical situation. ?Dermatologic: Venous rashes no ulcers noted.  No changes consistent with cellulitis. ?Lymph : No lichenification or skin changes of chronic lymphedema. ? ?CBC ?Lab Results  ?Component Value Date  ? WBC 7.1 10/30/2017  ? HGB 8.3 (L) 10/30/2017  ? HCT 25.2 (L) 10/30/2017  ? MCV 93.4 10/30/2017  ? PLT 251 10/30/2017  ? ? ?BMET ?   ?Component Value Date/Time  ? NA 138 10/29/2017 0423  ? K 3.5 10/29/2017 0423  ? CL 107 10/29/2017 0423  ? CO2 25 10/29/2017 0423  ? GLUCOSE 92 10/29/2017 0423  ? BUN 8 10/29/2017 0423  ? CREATININE 0.50 10/29/2017 0423  ? CALCIUM 7.9 (L) 10/29/2017 0423  ? GFRNONAA >60 10/29/2017 0423  ? GFRAA >60 10/29/2017 0423  ? ?CrCl cannot be calculated (Patient's most recent lab result is older than the maximum 21 days allowed.). ? ?COAG ?No results found for: INR, PROTIME ? ?Radiology ?No results  found. ? ? ?Assessment/Plan ?1. Varicose veins with pain ?Recommend: ? ?Her varicosities are causing her significant discomfort. Laser ablation is not indicated.  ? ?Patient should undergo injection sclerotherapy to treat the varicosities of the right leg. ? ?The risks, benefits and alternative therapies were reviewed in detail with the patient.  All questions were answered.  The patient agrees to proceed with sclerotherapy at their convenience. ? ?The patient will continue wearing the graduated compression stockings and using the over-the-counter pain medications to treat her symptoms.  ? ?   ?2. Chronic venous insufficiency ?No surgery or intervention at this point in time.   ? ?I have discussed with the patient venous insufficiency and why it  causes symptoms. I have discussed with the patient the chronic skin changes that accompany venous insufficiency and the long term sequela such as infection and ulceration.  Patient will begin wearing graduated compression stockings or compression wraps on a daily basis.  The patient will put the compression on first thing in the morning and removing them in the evening. The  patient is instructed specifically not to sleep in the compression.   ? ?In addition, behavioral modification including several periods of elevation of the lower extremities during the day will be continued. I have demonstrated that proper elevation is a position with the ankles at heart level. ? ?The patient is instructed to begin routine exercise, especially walking on a daily basis ? ?  ? ? ? ?Levora Dredge, MD ? ?04/02/2022 ?9:38 AM ? ?  ?

## 2022-04-16 ENCOUNTER — Telehealth (INDEPENDENT_AMBULATORY_CARE_PROVIDER_SITE_OTHER): Payer: Self-pay | Admitting: Nurse Practitioner

## 2022-04-16 NOTE — Telephone Encounter (Signed)
LVM for pt TCB and schedule sclero appt ? ?right leg SALINE sclero. no auth req. see gs/fb ?

## 2022-05-04 ENCOUNTER — Encounter (INDEPENDENT_AMBULATORY_CARE_PROVIDER_SITE_OTHER): Payer: Self-pay | Admitting: Nurse Practitioner

## 2022-05-04 ENCOUNTER — Ambulatory Visit (INDEPENDENT_AMBULATORY_CARE_PROVIDER_SITE_OTHER): Payer: Commercial Managed Care - PPO | Admitting: Nurse Practitioner

## 2022-05-04 VITALS — BP 173/79 | HR 101 | Resp 18 | Ht 60.0 in | Wt 146.0 lb

## 2022-05-04 DIAGNOSIS — I83819 Varicose veins of unspecified lower extremities with pain: Secondary | ICD-10-CM

## 2022-05-07 ENCOUNTER — Ambulatory Visit (INDEPENDENT_AMBULATORY_CARE_PROVIDER_SITE_OTHER): Payer: Commercial Managed Care - PPO | Admitting: Podiatry

## 2022-05-07 ENCOUNTER — Encounter: Payer: Self-pay | Admitting: Podiatry

## 2022-05-07 ENCOUNTER — Encounter (INDEPENDENT_AMBULATORY_CARE_PROVIDER_SITE_OTHER): Payer: Self-pay | Admitting: Nurse Practitioner

## 2022-05-07 DIAGNOSIS — B351 Tinea unguium: Secondary | ICD-10-CM | POA: Diagnosis not present

## 2022-05-07 DIAGNOSIS — L603 Nail dystrophy: Secondary | ICD-10-CM

## 2022-05-07 DIAGNOSIS — M79609 Pain in unspecified limb: Secondary | ICD-10-CM | POA: Diagnosis not present

## 2022-05-07 DIAGNOSIS — M79676 Pain in unspecified toe(s): Secondary | ICD-10-CM

## 2022-05-07 MED ORDER — TERBINAFINE HCL 250 MG PO TABS: 250.0000 mg | ORAL_TABLET | Freq: Every day | ORAL | 0 refills | Status: DC

## 2022-05-07 NOTE — Progress Notes (Signed)
Varicose veins of right  lower extremity with inflammation (454.1  I83.10) Current Plans   Indication: Patient presents with symptomatic varicose veins of the right  lower extremity.   Procedure: Sclerotherapy using hypertonic saline mixed with 1% Lidocaine was performed on the right lower extremity. Compression wraps were placed. The patient tolerated the procedure well. 

## 2022-05-07 NOTE — Progress Notes (Signed)
She presents today for follow-up of her nail pathology.  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  Assessment: Trichophyton rubrum.  Plan: We will going to start oral therapy.  We did discuss pros and cons of oral therapy and this possible side effects that are associated with this drug.  She understands and is amenable to it.  We also discussed topical as well as laser therapy.  We will start her on terbinafine 250 mg tablets.  She will take 1 tablet a day I will follow-up with her in 30 days for blood work.  She should have questions or concerns she will notify us immediately.

## 2022-05-30 ENCOUNTER — Ambulatory Visit (INDEPENDENT_AMBULATORY_CARE_PROVIDER_SITE_OTHER): Payer: Commercial Managed Care - PPO | Admitting: Nurse Practitioner

## 2022-05-30 ENCOUNTER — Encounter (INDEPENDENT_AMBULATORY_CARE_PROVIDER_SITE_OTHER): Payer: Self-pay | Admitting: Nurse Practitioner

## 2022-05-30 VITALS — BP 144/80 | HR 94 | Resp 16

## 2022-05-30 DIAGNOSIS — I83819 Varicose veins of unspecified lower extremities with pain: Secondary | ICD-10-CM

## 2022-06-06 ENCOUNTER — Encounter: Payer: Self-pay | Admitting: Podiatry

## 2022-06-06 ENCOUNTER — Ambulatory Visit: Payer: Commercial Managed Care - PPO | Admitting: Podiatry

## 2022-06-06 DIAGNOSIS — Z79899 Other long term (current) drug therapy: Secondary | ICD-10-CM

## 2022-06-06 DIAGNOSIS — L603 Nail dystrophy: Secondary | ICD-10-CM

## 2022-06-06 MED ORDER — TERBINAFINE HCL 250 MG PO TABS: 250.0000 mg | ORAL_TABLET | Freq: Every day | ORAL | 0 refills | Status: DC

## 2022-06-06 NOTE — Progress Notes (Signed)
Bailey Dorsey presents today for follow-up of her nail fungus.  She is completed her first 30 days of Lamisil denies any side effects.  No fever chills nausea vomit muscle aches pains itching or rashes.  Objective: No change in the nail physical goal exam yet.  Assessment: Long-term therapy onychomycosis.  Plan: We are requesting blood work today complete metabolic panel.  Also sent over another 90 days of medication.  I will follow-up with her in 4 months.  She will call with questions or concerns.  Should her blood work come back abnormal I will notify her immediately.

## 2022-06-07 LAB — COMPREHENSIVE METABOLIC PANEL
ALT: 15 IU/L (ref 0–32)
AST: 19 IU/L (ref 0–40)
Albumin/Globulin Ratio: 1.5 (ref 1.2–2.2)
Albumin: 4.4 g/dL (ref 3.8–4.8)
Alkaline Phosphatase: 106 IU/L (ref 44–121)
BUN/Creatinine Ratio: 23 (ref 12–28)
BUN: 15 mg/dL (ref 8–27)
Bilirubin Total: 0.3 mg/dL (ref 0.0–1.2)
CO2: 26 mmol/L (ref 20–29)
Calcium: 10.1 mg/dL (ref 8.7–10.3)
Chloride: 102 mmol/L (ref 96–106)
Creatinine, Ser: 0.65 mg/dL (ref 0.57–1.00)
Globulin, Total: 3 g/dL (ref 1.5–4.5)
Glucose: 116 mg/dL — ABNORMAL HIGH (ref 70–99)
Potassium: 4.5 mmol/L (ref 3.5–5.2)
Sodium: 141 mmol/L (ref 134–144)
Total Protein: 7.4 g/dL (ref 6.0–8.5)
eGFR: 97 mL/min/{1.73_m2} (ref >59)

## 2022-06-10 ENCOUNTER — Encounter (INDEPENDENT_AMBULATORY_CARE_PROVIDER_SITE_OTHER): Payer: Self-pay | Admitting: Nurse Practitioner

## 2022-06-12 ENCOUNTER — Telehealth: Payer: Self-pay | Admitting: *Deleted

## 2022-06-12 NOTE — Telephone Encounter (Signed)
Patient notified

## 2022-06-27 ENCOUNTER — Ambulatory Visit (INDEPENDENT_AMBULATORY_CARE_PROVIDER_SITE_OTHER): Payer: Commercial Managed Care - PPO | Admitting: Nurse Practitioner

## 2022-07-03 ENCOUNTER — Ambulatory Visit (INDEPENDENT_AMBULATORY_CARE_PROVIDER_SITE_OTHER): Payer: Commercial Managed Care - PPO | Admitting: Nurse Practitioner

## 2022-07-03 ENCOUNTER — Encounter (INDEPENDENT_AMBULATORY_CARE_PROVIDER_SITE_OTHER): Payer: Self-pay | Admitting: Nurse Practitioner

## 2022-07-03 VITALS — BP 131/75 | HR 89 | Resp 17 | Ht 60.0 in | Wt 143.0 lb

## 2022-07-03 DIAGNOSIS — I8311 Varicose veins of right lower extremity with inflammation: Secondary | ICD-10-CM | POA: Diagnosis not present

## 2022-07-03 DIAGNOSIS — I83819 Varicose veins of unspecified lower extremities with pain: Secondary | ICD-10-CM

## 2022-07-03 NOTE — Progress Notes (Signed)
Varicose veins of right  lower extremity with inflammation (454.1  I83.10) Current Plans   Indication: Patient presents with symptomatic varicose veins of the right  lower extremity.   Procedure: Sclerotherapy using hypertonic saline mixed with 1% Lidocaine was performed on the right lower extremity. Compression wraps were placed. The patient tolerated the procedure well. 

## 2022-07-04 ENCOUNTER — Telehealth: Payer: Self-pay | Admitting: Podiatry

## 2022-07-04 NOTE — Telephone Encounter (Signed)
Pt requested a call back about stopping some medication. She states he said last appt that it was up to her but she just wants to make sure.

## 2022-07-26 ENCOUNTER — Ambulatory Visit (INDEPENDENT_AMBULATORY_CARE_PROVIDER_SITE_OTHER): Payer: Commercial Managed Care - PPO | Admitting: Nurse Practitioner

## 2022-08-08 ENCOUNTER — Encounter (INDEPENDENT_AMBULATORY_CARE_PROVIDER_SITE_OTHER): Payer: Commercial Managed Care - PPO

## 2022-08-08 ENCOUNTER — Ambulatory Visit (INDEPENDENT_AMBULATORY_CARE_PROVIDER_SITE_OTHER): Payer: Commercial Managed Care - PPO | Admitting: Nurse Practitioner

## 2022-08-09 ENCOUNTER — Ambulatory Visit (INDEPENDENT_AMBULATORY_CARE_PROVIDER_SITE_OTHER): Payer: Commercial Managed Care - PPO | Admitting: Nurse Practitioner

## 2022-08-09 ENCOUNTER — Encounter (INDEPENDENT_AMBULATORY_CARE_PROVIDER_SITE_OTHER): Payer: Commercial Managed Care - PPO

## 2022-08-29 ENCOUNTER — Encounter (INDEPENDENT_AMBULATORY_CARE_PROVIDER_SITE_OTHER): Payer: Commercial Managed Care - PPO

## 2022-08-29 ENCOUNTER — Ambulatory Visit (INDEPENDENT_AMBULATORY_CARE_PROVIDER_SITE_OTHER): Payer: Commercial Managed Care - PPO | Admitting: Nurse Practitioner

## 2022-10-12 ENCOUNTER — Ambulatory Visit (INDEPENDENT_AMBULATORY_CARE_PROVIDER_SITE_OTHER): Payer: Commercial Managed Care - PPO | Admitting: Nurse Practitioner

## 2022-10-12 ENCOUNTER — Encounter (INDEPENDENT_AMBULATORY_CARE_PROVIDER_SITE_OTHER): Payer: Medicare Other

## 2022-10-15 ENCOUNTER — Other Ambulatory Visit (INDEPENDENT_AMBULATORY_CARE_PROVIDER_SITE_OTHER): Payer: Self-pay | Admitting: Nurse Practitioner

## 2022-10-15 ENCOUNTER — Encounter (INDEPENDENT_AMBULATORY_CARE_PROVIDER_SITE_OTHER): Payer: Self-pay

## 2022-10-15 DIAGNOSIS — M79604 Pain in right leg: Secondary | ICD-10-CM

## 2022-10-16 ENCOUNTER — Encounter (INDEPENDENT_AMBULATORY_CARE_PROVIDER_SITE_OTHER): Payer: Self-pay | Admitting: Nurse Practitioner

## 2022-10-16 ENCOUNTER — Ambulatory Visit (INDEPENDENT_AMBULATORY_CARE_PROVIDER_SITE_OTHER): Payer: Commercial Managed Care - PPO

## 2022-10-16 ENCOUNTER — Ambulatory Visit (INDEPENDENT_AMBULATORY_CARE_PROVIDER_SITE_OTHER): Payer: Commercial Managed Care - PPO | Admitting: Nurse Practitioner

## 2022-10-16 ENCOUNTER — Other Ambulatory Visit (INDEPENDENT_AMBULATORY_CARE_PROVIDER_SITE_OTHER): Payer: Self-pay | Admitting: Nurse Practitioner

## 2022-10-16 VITALS — BP 160/81 | HR 88 | Resp 18 | Ht 59.0 in | Wt 144.6 lb

## 2022-10-16 DIAGNOSIS — I83819 Varicose veins of unspecified lower extremities with pain: Secondary | ICD-10-CM

## 2022-10-16 DIAGNOSIS — I83812 Varicose veins of left lower extremities with pain: Secondary | ICD-10-CM

## 2022-10-17 ENCOUNTER — Ambulatory Visit: Payer: Commercial Managed Care - PPO | Admitting: Podiatry

## 2022-10-17 ENCOUNTER — Telehealth (INDEPENDENT_AMBULATORY_CARE_PROVIDER_SITE_OTHER): Payer: Self-pay | Admitting: Nurse Practitioner

## 2022-10-17 NOTE — Telephone Encounter (Signed)
Patient was seen yesterday in office. Called and LVM stating that she needed to ask one question related to her visit. Wanting provider to call her back or a nurse     Please call and advise

## 2022-10-17 NOTE — Telephone Encounter (Signed)
Patient wanted to know what a "small place" meant in relation to having a laser procedure. The patient thought it meant she would have a scar from an incision. I explained that the laser procedure is a series of needle sticks and at the initial needle stick site it may become dark. I also explained that over time it usually lightens up per individual pigmentation of the patient.

## 2022-10-21 ENCOUNTER — Encounter (INDEPENDENT_AMBULATORY_CARE_PROVIDER_SITE_OTHER): Payer: Self-pay | Admitting: Nurse Practitioner

## 2022-10-21 NOTE — Progress Notes (Signed)
Subjective:    Patient ID: Bailey Dorsey, female    DOB: October 05, 1956, 66 y.o.   MRN: [id number] No chief complaint on file.   The patient is seen for evaluation of symptomatic varicose veins. The patient relates burning and stinging which worsened steadily throughout the course of the day, particularly with standing. The patient also notes an aching and throbbing pain over the varicosities, particularly with prolonged dependent positions. The symptoms are significantly improved with elevation.  The patient also notes that during hot weather the symptoms are greatly intensified. The patient states the pain from the varicose veins interferes with work, daily exercise, shopping and household maintenance. At this point, the symptoms are persistent and severe enough that they're having a negative impact on lifestyle and are interfering with daily activities.  There is no history of DVT, PE or superficial thrombophlebitis. There is no history of ulceration or hemorrhage. The patient denies a significant family history of varicose veins.  The patient has worn graduated compression in the past. At the present time the patient has been using over-the-counter analgesics.  The patient recently underwent sclerotherapy on her right lower extremity     Review of Systems     Objective:   Physical Exam  BP (!) 160/81 (BP Location: Right Arm)   Pulse 88   Resp 18   Ht 4\' 11"  (1.499 m)   Wt 144 lb 9.6 oz (65.6 kg)   BMI 29.21 kg/m   History reviewed. No pertinent past medical history.  Social History   Socioeconomic History   Marital status: Married    Spouse name: Not on file   Number of children: Not on file   Years of education: Not on file   Highest education level: Not on file  Occupational History   Not on file  Tobacco Use   Smoking status: Former    Types: Cigarettes    Quit date: 2017    Years since quitting: 6.8   Smokeless tobacco: Never  Substance and Sexual Activity    Alcohol use: No   Drug use: No   Sexual activity: Never  Other Topics Concern   Not on file  Social History Narrative   Not on file   Social Determinants of Health   Financial Resource Strain: Not on file  Food Insecurity: Not on file  Transportation Needs: Not on file  Physical Activity: Not on file  Stress: Not on file  Social Connections: Not on file  Intimate Partner Violence: Not on file    Past Surgical History:  Procedure Laterality Date   ESOPHAGOGASTRODUODENOSCOPY (EGD) WITH PROPOFOL N/A 10/29/2017   Procedure: ESOPHAGOGASTRODUODENOSCOPY (EGD) WITH PROPOFOL;  Surgeon: 10/31/2017, MD;  Location: [facility] ENDOSCOPY;  Service: Endoscopy;  Laterality: N/A;    Family History  Problem Relation Age of Onset   Heart Problems Mother    Renal Disease Mother    Heart attack Father    Diabetes Sister     Allergies  Allergen Reactions   Amoxicillin-Pot Clavulanate Nausea And Vomiting       Latest Ref Rng & Units 10/30/2017    4:38 AM 10/29/2017    4:23 AM 10/28/2017   10:37 PM  CBC  WBC 3.6 - 11.0 K/uL 7.1  7.3    Hemoglobin 12.0 - 16.0 g/dL 8.3  8.0  8.2   Hematocrit 35.0 - 47.0 % 25.2  24.5  24.8   Platelets 150 - 440 K/uL 251  247  CMP     Component Value Date/Time   NA 141 06/06/2022 1550   K 4.5 06/06/2022 1550   CL 102 06/06/2022 1550   CO2 26 06/06/2022 1550   GLUCOSE 116 (H) 06/06/2022 1550   GLUCOSE 92 10/29/2017 0423   BUN 15 06/06/2022 1550   CREATININE 0.65 06/06/2022 1550   CALCIUM 10.1 06/06/2022 1550   PROT 7.4 06/06/2022 1550   ALBUMIN 4.4 06/06/2022 1550   AST 19 06/06/2022 1550   ALT 15 06/06/2022 1550   ALKPHOS 106 06/06/2022 1550   BILITOT 0.3 06/06/2022 1550   GFRNONAA >60 10/29/2017 0423   GFRAA >60 10/29/2017 0423     No results found.     Assessment & Plan:   1. Varicose veins with pain Recommend  I have reviewed my previous  discussion with the patient regarding  varicose veins and why they cause symptoms.  Patient will continue  wearing graduated compression stockings class 1 on a daily basis, beginning first thing in the morning and removing them in the evening.  The patient is CEAP [id number]   In addition, behavioral modification including elevation during the day was again discussed and this will continue.  The patient has utilized over the counter pain medications and has been exercising.  However, at this time conservative therapy has not alleviated the patient's symptoms of leg pain and swelling  Recommend: laser ablation of the left great saphenous veins to eliminate the symptoms of pain and swelling of the lower extremities caused by the severe superficial venous reflux disease.    Current Outpatient Medications on File Prior to Visit  Medication Sig Dispense Refill   alendronate (FOSAMAX) 70 MG tablet Take 70 mg by mouth once a week.     atorvastatin (LIPITOR) 20 MG tablet Take 20 mg by mouth daily.     albuterol (VENTOLIN HFA) 108 (90 Base) MCG/ACT inhaler Inhale 2 puffs every 6 (six) hours as needed into the lungs for wheezing.     No current facility-administered medications on file prior to visit.    There are no Patient Instructions on file for this visit. No follow-ups on file.   Kris Hartmann, NP

## 2022-10-21 NOTE — Progress Notes (Incomplete)
Subjective:    Patient ID: Bailey Dorsey, female    DOB: Apr 25, 1956, 66 y.o.   MRN: [id number] No chief complaint on file.   HPI  Review of Systems     Objective:   Physical Exam  BP (!) 160/81 (BP Location: Right Arm)   Pulse 88   Resp 18   Ht 4\' 11"  (1.499 m)   Wt 144 lb 9.6 oz (65.6 kg)   BMI 29.21 kg/m   History reviewed. No pertinent past medical history.  Social History   Socioeconomic History  . Marital status: Married    Spouse name: Not on file  . Number of children: Not on file  . Years of education: Not on file  . Highest education level: Not on file  Occupational History  . Not on file  Tobacco Use  . Smoking status: Former    Types: Cigarettes    Quit date: 2017    Years since quitting: 6.8  . Smokeless tobacco: Never  Substance and Sexual Activity  . Alcohol use: No  . Drug use: No  . Sexual activity: Never  Other Topics Concern  . Not on file  Social History Narrative  . Not on file   Social Determinants of Health   Financial Resource Strain: Not on file  Food Insecurity: Not on file  Transportation Needs: Not on file  Physical Activity: Not on file  Stress: Not on file  Social Connections: Not on file  Intimate Partner Violence: Not on file    Past Surgical History:  Procedure Laterality Date  . ESOPHAGOGASTRODUODENOSCOPY (EGD) WITH PROPOFOL N/A 10/29/2017   Procedure: ESOPHAGOGASTRODUODENOSCOPY (EGD) WITH PROPOFOL;  Surgeon: 10/31/2017, MD;  Location: [facility] ENDOSCOPY;  Service: Endoscopy;  Laterality: N/A;    Family History  Problem Relation Age of Onset  . Heart Problems Mother   . Renal Disease Mother   . Heart attack Father   . Diabetes Sister     Allergies  Allergen Reactions  . Amoxicillin-Pot Clavulanate Nausea And Vomiting       Latest Ref Rng & Units 10/30/2017    4:38 AM 10/29/2017    4:23 AM 10/28/2017   10:37 PM  CBC  WBC 3.6 - 11.0 K/uL 7.1  7.3    Hemoglobin 12.0 - 16.0 g/dL 8.3  8.0  8.2    Hematocrit 35.0 - 47.0 % 25.2  24.5  24.8   Platelets 150 - 440 K/uL 251  247        CMP     Component Value Date/Time   NA 141 06/06/2022 1550   K 4.5 06/06/2022 1550   CL 102 06/06/2022 1550   CO2 26 06/06/2022 1550   GLUCOSE 116 (H) 06/06/2022 1550   GLUCOSE 92 10/29/2017 0423   BUN 15 06/06/2022 1550   CREATININE 0.65 06/06/2022 1550   CALCIUM 10.1 06/06/2022 1550   PROT 7.4 06/06/2022 1550   ALBUMIN 4.4 06/06/2022 1550   AST 19 06/06/2022 1550   ALT 15 06/06/2022 1550   ALKPHOS 106 06/06/2022 1550   BILITOT 0.3 06/06/2022 1550   GFRNONAA >60 10/29/2017 0423   GFRAA >60 10/29/2017 0423     No results found.     Assessment & Plan:   1. Varicose veins with pain ***   Current Outpatient Medications on File Prior to Visit  Medication Sig Dispense Refill  . alendronate (FOSAMAX) 70 MG tablet Take 70 mg by mouth once a week.    10/31/2017 atorvastatin (LIPITOR) 20 MG  tablet Take 20 mg by mouth daily.    Marland Kitchen albuterol (VENTOLIN HFA) 108 (90 Base) MCG/ACT inhaler Inhale 2 puffs every 6 (six) hours as needed into the lungs for wheezing.     No current facility-administered medications on file prior to visit.    There are no Patient Instructions on file for this visit. No follow-ups on file.   Kris Hartmann, NP

## 2022-11-05 ENCOUNTER — Ambulatory Visit: Payer: Commercial Managed Care - PPO | Admitting: Podiatry

## 2022-11-05 ENCOUNTER — Encounter: Payer: Self-pay | Admitting: Podiatry

## 2022-11-05 DIAGNOSIS — L603 Nail dystrophy: Secondary | ICD-10-CM | POA: Diagnosis not present

## 2022-11-05 MED ORDER — TERBINAFINE HCL 250 MG PO TABS: 250.0000 mg | ORAL_TABLET | Freq: Every day | ORAL | 0 refills | Status: DC

## 2022-11-05 NOTE — Progress Notes (Signed)
She presents today for follow-up of her nail fungus she states that she completed 90 days worth of the medicine but did not complete the last month.  She states that the left foot still has some growing out today but I was scared to continue the medication.  Has just recently had blood work done by her primary care provider back in September.  Objective: Vital signs stable alert oriented x3 there is no erythema edema salines drainage odor she does have distal onychomycosis to the hallux nail left.  Assessment: Onychomycosis.  Plan: At this point I reviewed her blood work make sure that it was good did prescribe her another 60 days worth of Lamisil and I like to follow-up with her in 3 months.  Should she have problems taking this she is notify us immediately.

## 2022-12-24 ENCOUNTER — Telehealth (INDEPENDENT_AMBULATORY_CARE_PROVIDER_SITE_OTHER): Payer: Self-pay | Admitting: Vascular Surgery

## 2022-12-24 NOTE — Telephone Encounter (Signed)
Spoke with pt regarding scheduling of laser ablation procedure. I explained the back order of supplies and that I have everyone on the list and will be doing PA and scheduling as soon as I hear when we will be receiving the supplies.

## 2023-01-24 ENCOUNTER — Telehealth (INDEPENDENT_AMBULATORY_CARE_PROVIDER_SITE_OTHER): Payer: Self-pay | Admitting: Vascular Surgery

## 2023-01-24 NOTE — Telephone Encounter (Signed)
Pt called to inquire about scheduling of the laser ablation appt that she needs. I advised we are still in a no scheduling holding pattern due to the back order of supplies needed to perform the ablation. Pt was very understanding and stated that she will call back in around 3 weeks to check again if she has not heard from me first, I did explain that she will be one of the first patients to be scheduled due to the fact she has been waiting since October. Nothing further needed at this time.

## 2023-02-06 ENCOUNTER — Ambulatory Visit: Payer: Commercial Managed Care - PPO | Admitting: Podiatry

## 2023-02-06 DIAGNOSIS — Z79899 Other long term (current) drug therapy: Secondary | ICD-10-CM | POA: Diagnosis not present

## 2023-02-06 MED ORDER — TERBINAFINE HCL 250 MG PO TABS: 250.0000 mg | ORAL_TABLET | Freq: Every day | ORAL | 0 refills | Status: DC

## 2023-02-06 NOTE — Progress Notes (Signed)
Bailey Dorsey presents today for follow-up of her onychomycosis.  She states that it seems to be doing better.  Objective: Vital signs are stable she is alert oriented x 3 toenails are slightly elongated and has a sharp incurvated nail margin along the tibial border of the hallux left.  There is no erythema cellulitis drainage or odor however the nail appears to be growing out nicely.  Assessment: Long-term therapy onychomycosis with Lamisil.  Plan: Continue Lamisil 30 #1 p.o. q. OD.  I will follow-up with her in 3 months

## 2023-02-07 ENCOUNTER — Other Ambulatory Visit: Payer: Self-pay | Admitting: Podiatry

## 2023-02-11 ENCOUNTER — Telehealth: Payer: Self-pay

## 2023-02-13 NOTE — Telephone Encounter (Signed)
Called pharmacy and they said that her insurance has plan limitations,covers only 30 tablets in a year,pharmacy said that this has been explained to patient as well. Called  insurance and waiting on  prior authorization, still waiting but an appeal may be needed.

## 2023-02-13 NOTE — Telephone Encounter (Signed)
No , just  received

## 2023-02-14 ENCOUNTER — Telehealth: Payer: Self-pay | Admitting: *Deleted

## 2023-02-14 NOTE — Telephone Encounter (Signed)
Called patient to update concerning [place], left message if  questions to call back.

## 2023-02-15 MED ORDER — TERBINAFINE HCL 250 MG PO TABS: 250.0000 mg | ORAL_TABLET | Freq: Every day | ORAL | 0 refills | Status: DC

## 2023-02-15 NOTE — Telephone Encounter (Signed)
Sent in Rx to [facility] [address] as requested with a note stating patient will be using [employer] discount

## 2023-03-18 ENCOUNTER — Telehealth (INDEPENDENT_AMBULATORY_CARE_PROVIDER_SITE_OTHER): Payer: Self-pay | Admitting: Vascular Surgery

## 2023-03-18 NOTE — Telephone Encounter (Signed)
LVM for pt advising that I had submitted for PA for laser ablation with Dr. Delana Meyer and that I will call when I hear back from insurance company. Nothing further needed at this time.

## 2023-05-06 ENCOUNTER — Encounter (INDEPENDENT_AMBULATORY_CARE_PROVIDER_SITE_OTHER): Payer: Medicare Other

## 2023-05-08 ENCOUNTER — Encounter: Payer: Medicare Other | Admitting: Podiatry

## 2023-05-15 ENCOUNTER — Ambulatory Visit (INDEPENDENT_AMBULATORY_CARE_PROVIDER_SITE_OTHER): Payer: Commercial Managed Care - PPO | Admitting: Podiatry

## 2023-05-15 ENCOUNTER — Encounter: Payer: Self-pay | Admitting: Podiatry

## 2023-05-15 DIAGNOSIS — L603 Nail dystrophy: Secondary | ICD-10-CM

## 2023-05-15 NOTE — Progress Notes (Signed)
She presents today for follow-up of her nail fungus she has completed her second every other day dosing of Lamisil.  States that I think they are looking good I have no problems with him whatsoever.  Objective: Vital signs are stable alert oriented x 3.  Pulses are palpable.  Toenails have grown out 100%.  I do not see any signs of nail fungus or tinea pedis.  Assessment: Well-healing onychomycosis long-term therapy with Lamisil.  Plan: I recommended that she discontinue use of Lamisil but keep a close watch to make sure there is no recurrence should there be any she is to notify us immediately.

## 2023-05-29 NOTE — Progress Notes (Signed)
    MRN : [id number]  Bailey Dorsey is a 67 y.o. ([birth year]) female who presents with chief complaint of No chief complaint on file. .    The patient's left lower extremity was sterilely prepped and draped.  The ultrasound machine was used to visualize the left great saphenous vein throughout its course.  A segment below the knee was selected for access.  The saphenous vein was accessed without difficulty using ultrasound guidance with a micropuncture needle.   An 0.018  wire was placed beyond the saphenofemoral junction through the sheath and the microneedle was removed.  The 65 cm sheath was then placed over the wire and the wire and dilator were removed.  The laser fiber was placed through the sheath and its tip was placed approximately 2 cm below the saphenofemoral junction.  Tumescent anesthesia was then created with a dilute lidocaine solution.  Laser energy was then delivered with constant withdrawal of the sheath and laser fiber.  Approximately 1739 Joules of energy were delivered over a length of 37 cm.  Sterile dressings were placed.  The patient tolerated the procedure well without complications.

## 2023-05-30 ENCOUNTER — Ambulatory Visit (INDEPENDENT_AMBULATORY_CARE_PROVIDER_SITE_OTHER): Payer: Commercial Managed Care - PPO | Admitting: Vascular Surgery

## 2023-05-30 VITALS — BP 144/80 | HR 94 | Resp 17 | Ht 60.0 in | Wt 146.0 lb

## 2023-05-30 DIAGNOSIS — I83812 Varicose veins of left lower extremities with pain: Secondary | ICD-10-CM | POA: Diagnosis not present

## 2023-05-30 DIAGNOSIS — I83819 Varicose veins of unspecified lower extremities with pain: Secondary | ICD-10-CM

## 2023-05-31 ENCOUNTER — Telehealth (INDEPENDENT_AMBULATORY_CARE_PROVIDER_SITE_OTHER): Payer: Self-pay

## 2023-05-31 NOTE — Telephone Encounter (Signed)
Patient called stating she had a laser procedure in office on 05/30/23 and was wondering how long  to elevate her legs and did she have to wear the compression socks on both legs. I advised to elevate her legs when sitting but walk as much as possible and this can take place over the next seven days. Patient was also informed that she can wear the compression sock on the leg that had the laser procedure only if she so chose to do so.

## 2023-06-02 ENCOUNTER — Encounter (INDEPENDENT_AMBULATORY_CARE_PROVIDER_SITE_OTHER): Payer: Self-pay | Admitting: Vascular Surgery

## 2023-06-04 ENCOUNTER — Other Ambulatory Visit (INDEPENDENT_AMBULATORY_CARE_PROVIDER_SITE_OTHER): Payer: Self-pay | Admitting: Vascular Surgery

## 2023-06-04 DIAGNOSIS — I83813 Varicose veins of bilateral lower extremities with pain: Secondary | ICD-10-CM

## 2023-06-06 ENCOUNTER — Ambulatory Visit (INDEPENDENT_AMBULATORY_CARE_PROVIDER_SITE_OTHER): Payer: Commercial Managed Care - PPO

## 2023-06-06 DIAGNOSIS — I83813 Varicose veins of bilateral lower extremities with pain: Secondary | ICD-10-CM | POA: Diagnosis not present

## 2023-06-25 ENCOUNTER — Ambulatory Visit (INDEPENDENT_AMBULATORY_CARE_PROVIDER_SITE_OTHER): Payer: Commercial Managed Care - PPO | Admitting: Nurse Practitioner

## 2023-06-25 ENCOUNTER — Encounter (INDEPENDENT_AMBULATORY_CARE_PROVIDER_SITE_OTHER): Payer: Self-pay | Admitting: Nurse Practitioner

## 2023-06-25 VITALS — BP 137/81 | HR 92 | Resp 16 | Wt 150.0 lb

## 2023-06-25 DIAGNOSIS — I83819 Varicose veins of unspecified lower extremities with pain: Secondary | ICD-10-CM

## 2023-06-26 ENCOUNTER — Encounter (INDEPENDENT_AMBULATORY_CARE_PROVIDER_SITE_OTHER): Payer: Self-pay | Admitting: Nurse Practitioner

## 2023-06-26 NOTE — Progress Notes (Signed)
Subjective:    Patient ID: Bailey Dorsey, female    DOB: 1956/03/22, 67 y.o.   MRN: [id number] Chief Complaint  Patient presents with   Follow-up    4 week post laser    The patient returns to the office for followup status post laser ablation of the left great saphenous vein on 05/30/2023.  The patient note significant improvement in the lower extremity pain but not resolution of the symptoms. The patient notes multiple residual varicosities bilaterally which continued to hurt with dependent positions and remained tender to palpation. The patient's swelling is minimally from preoperative status. The patient continues to wear graduated compression stockings on a daily basis but these are not eliminating the pain and discomfort. The patient continues to use over-the-counter anti-inflammatory medications to treat the pain and related symptoms but this has not given the patient relief. The patient notes the pain in the lower extremities is causing problems with daily exercise, problems at work and even with household activities such as preparing meals and doing dishes.  The patient is otherwise done well and there have been no complications related to the laser procedure or interval changes in the patient's overall   Post laser ultrasound shows successful ablation of the left great saphenous vein     Review of Systems  Cardiovascular:  Positive for leg swelling.  All other systems reviewed and are negative.      Objective:   Physical Exam Vitals reviewed.  HENT:     Head: Normocephalic.  Cardiovascular:     Rate and Rhythm: Normal rate.     Pulses: Normal pulses.  Pulmonary:     Effort: Pulmonary effort is normal.  Musculoskeletal:        General: Tenderness present.  Skin:    General: Skin is warm and dry.  Neurological:     Mental Status: She is alert and oriented to person, place, and time.  Psychiatric:        Mood and Affect: Mood normal.        Behavior: Behavior  normal.        Thought Content: Thought content normal.        Judgment: Judgment normal.     BP 137/81 (BP Location: Left Arm)   Pulse 92   Resp 16   Wt 150 lb (68 kg)   BMI 29.29 kg/m   History reviewed. No pertinent past medical history.  Social History   Socioeconomic History   Marital status: Married    Spouse name: Not on file   Number of children: Not on file   Years of education: Not on file   Highest education level: Not on file  Occupational History   Not on file  Tobacco Use   Smoking status: Former    Types: Cigarettes    Quit date: 2017    Years since quitting: 7.5   Smokeless tobacco: Never  Substance and Sexual Activity   Alcohol use: No   Drug use: No   Sexual activity: Never  Other Topics Concern   Not on file  Social History Narrative   Not on file   Social Determinants of Health   Financial Resource Strain: Not on file  Food Insecurity: Not on file  Transportation Needs: Not on file  Physical Activity: Not on file  Stress: Not on file  Social Connections: Not on file  Intimate Partner Violence: Not on file    Past Surgical History:  Procedure Laterality Date   ESOPHAGOGASTRODUODENOSCOPY (EGD)  WITH PROPOFOL N/A 10/29/2017   Procedure: ESOPHAGOGASTRODUODENOSCOPY (EGD) WITH PROPOFOL;  Surgeon: Midge Minium, MD;  Location: [facility] ENDOSCOPY;  Service: Endoscopy;  Laterality: N/A;    Family History  Problem Relation Age of Onset   Heart Problems Mother    Renal Disease Mother    Heart attack Father    Diabetes Sister     Allergies  Allergen Reactions   Amoxicillin-Pot Clavulanate Nausea And Vomiting       Latest Ref Rng & Units 10/30/2017    4:38 AM 10/29/2017    4:23 AM 10/28/2017   10:37 PM  CBC  WBC 3.6 - 11.0 K/uL 7.1  7.3    Hemoglobin 12.0 - 16.0 g/dL 8.3  8.0  8.2   Hematocrit 35.0 - 47.0 % 25.2  24.5  24.8   Platelets 150 - 440 K/uL 251  247        CMP     Component Value Date/Time   NA 141 06/06/2022 1550   K  4.5 06/06/2022 1550   CL 102 06/06/2022 1550   CO2 26 06/06/2022 1550   GLUCOSE 116 (H) 06/06/2022 1550   GLUCOSE 92 10/29/2017 0423   BUN 15 06/06/2022 1550   CREATININE 0.65 06/06/2022 1550   CALCIUM 10.1 06/06/2022 1550   PROT 7.4 06/06/2022 1550   ALBUMIN 4.4 06/06/2022 1550   AST 19 06/06/2022 1550   ALT 15 06/06/2022 1550   ALKPHOS 106 06/06/2022 1550   BILITOT 0.3 06/06/2022 1550   EGFR 97 06/06/2022 1550   GFRNONAA >60 10/29/2017 0423     No results found.     Assessment & Plan:   1. Varicose veins with pain Recommend:  The patient has had successful ablation of the previously incompetent saphenous venous system but still has persistent symptoms of pain and swelling that are having a negative impact on daily life and daily activities.  Patient should undergo injection sclerotherapy to treat the residual varicosities of the left lower extremity.  The risks, benefits and alternative therapies were reviewed in detail with the patient.  All questions were answered.  The patient agrees to proceed with sclerotherapy at their convenience.  The patient will continue wearing the graduated compression stockings and using the over-the-counter pain medications to treat her symptoms.       Current Outpatient Medications on File Prior to Visit  Medication Sig Dispense Refill   alendronate (FOSAMAX) 70 MG tablet Take 70 mg by mouth once a week.     atorvastatin (LIPITOR) 20 MG tablet Take 20 mg by mouth daily.     albuterol (VENTOLIN HFA) 108 (90 Base) MCG/ACT inhaler Inhale 2 puffs every 6 (six) hours as needed into the lungs for wheezing.     terbinafine (LAMISIL) 250 MG tablet Take 1 tablet (250 mg total) by mouth daily. 30 tablet 0   No current facility-administered medications on file prior to visit.    There are no Patient Instructions on file for this visit. No follow-ups on file.   Georgiana Spinner, NP

## 2023-08-14 ENCOUNTER — Ambulatory Visit (INDEPENDENT_AMBULATORY_CARE_PROVIDER_SITE_OTHER): Payer: Commercial Managed Care - PPO | Admitting: Nurse Practitioner

## 2023-09-13 ENCOUNTER — Ambulatory Visit (INDEPENDENT_AMBULATORY_CARE_PROVIDER_SITE_OTHER): Payer: Commercial Managed Care - PPO | Admitting: Nurse Practitioner

## 2023-10-14 ENCOUNTER — Ambulatory Visit (INDEPENDENT_AMBULATORY_CARE_PROVIDER_SITE_OTHER): Payer: Commercial Managed Care - PPO | Admitting: Nurse Practitioner

## 2023-10-14 ENCOUNTER — Encounter (INDEPENDENT_AMBULATORY_CARE_PROVIDER_SITE_OTHER): Payer: Self-pay | Admitting: Nurse Practitioner

## 2023-10-14 VITALS — BP 125/74 | HR 93 | Resp 18 | Ht 60.0 in | Wt 150.0 lb

## 2023-10-14 DIAGNOSIS — I83812 Varicose veins of left lower extremities with pain: Secondary | ICD-10-CM

## 2023-10-14 DIAGNOSIS — I83819 Varicose veins of unspecified lower extremities with pain: Secondary | ICD-10-CM

## 2023-10-14 NOTE — Progress Notes (Signed)
Varicose veins of left lower extremity with inflammation (454.1  I83.10) Current Plans   Indication: Patient presents with symptomatic varicose veins of the left lower extremity.   Procedure: Sclerotherapy using hypertonic saline mixed with 1% Lidocaine was performed on the left lower extremity. Compression wraps were placed. The patient tolerated the procedure well. 

## 2023-10-21 ENCOUNTER — Encounter: Payer: Self-pay | Admitting: Podiatry

## 2023-10-21 ENCOUNTER — Ambulatory Visit: Payer: Commercial Managed Care - PPO | Admitting: Podiatry

## 2023-10-21 ENCOUNTER — Ambulatory Visit (INDEPENDENT_AMBULATORY_CARE_PROVIDER_SITE_OTHER): Payer: Commercial Managed Care - PPO

## 2023-10-21 DIAGNOSIS — M722 Plantar fascial fibromatosis: Secondary | ICD-10-CM

## 2023-10-21 MED ORDER — TRIAMCINOLONE ACETONIDE 40 MG/ML IJ SUSP
20.0000 mg | Freq: Once | INTRAMUSCULAR | Status: AC
  Administered 2023-10-21: 20 mg

## 2023-10-21 MED ORDER — METHYLPREDNISOLONE 4 MG PO TBPK: ORAL_TABLET | ORAL | 0 refills | Status: DC

## 2023-10-21 NOTE — Progress Notes (Signed)
She presents today for chief complaint of plantar heel pain right.  States has been aching for about a month.  Objective: Vital signs are stable alert and oriented x 3.  There is no erythema edema cellulitis drainage or odor.  She has pain on palpation medial calcaneal tubercle of the right heel.  Radiographs taken today do demonstrate a soft tissue increase in density of plantar fascial cannula insertion site with a significant plantar distally oriented calcaneal heel spur.  No fractures are identified.  There is soft tissue swelling of the ankle and the leg.  Assessment: Planter fasciitis right foot.  Plan: Discussed etiology pathology and surgical therapies I injected the right heel today 20 mg Kenalog 5 mg Marcaine point of maximal tenderness.  Started her on methylprednisolone.  I did not prescribe any nonsteroidal anti-inflammatory drugs due to her diverticulitis.  Did dispense a plantar fascia brace and discussed appropriate shoe gear.

## 2023-11-13 ENCOUNTER — Ambulatory Visit (INDEPENDENT_AMBULATORY_CARE_PROVIDER_SITE_OTHER): Payer: Medicare Other | Admitting: Nurse Practitioner

## 2023-11-20 ENCOUNTER — Ambulatory Visit (INDEPENDENT_AMBULATORY_CARE_PROVIDER_SITE_OTHER): Payer: Commercial Managed Care - PPO | Admitting: Nurse Practitioner

## 2023-11-20 ENCOUNTER — Encounter (INDEPENDENT_AMBULATORY_CARE_PROVIDER_SITE_OTHER): Payer: Self-pay | Admitting: Nurse Practitioner

## 2023-11-20 VITALS — BP 150/86 | HR 76 | Resp 18

## 2023-11-20 DIAGNOSIS — I83813 Varicose veins of bilateral lower extremities with pain: Secondary | ICD-10-CM

## 2023-11-20 NOTE — Progress Notes (Signed)
Varicose veins of left lower extremity with inflammation (454.1  I83.10) Current Plans   Indication: Patient presents with symptomatic varicose veins of the left lower extremity.   Procedure: Sclerotherapy using hypertonic saline mixed with 1% Lidocaine was performed on the left lower extremity. Compression wraps were placed. The patient tolerated the procedure well. 

## 2023-12-02 ENCOUNTER — Ambulatory Visit: Payer: Medicare Other | Admitting: Podiatry

## 2023-12-19 ENCOUNTER — Encounter (INDEPENDENT_AMBULATORY_CARE_PROVIDER_SITE_OTHER): Payer: Self-pay | Admitting: Nurse Practitioner

## 2023-12-19 ENCOUNTER — Ambulatory Visit (INDEPENDENT_AMBULATORY_CARE_PROVIDER_SITE_OTHER): Payer: Commercial Managed Care - PPO | Admitting: Nurse Practitioner

## 2023-12-19 VITALS — BP 144/84 | HR 98 | Resp 16 | Wt 150.0 lb

## 2023-12-19 DIAGNOSIS — I83812 Varicose veins of left lower extremities with pain: Secondary | ICD-10-CM | POA: Diagnosis not present

## 2023-12-19 DIAGNOSIS — I83819 Varicose veins of unspecified lower extremities with pain: Secondary | ICD-10-CM

## 2023-12-19 NOTE — Progress Notes (Signed)
 Varicose veins of left lower extremity with pain (454.1  I83.10) Current Plans   Indication: Patient presents with symptomatic varicose veins of the left lower extremity.   Procedure: Sclerotherapy using hypertonic saline mixed with 1% Lidocaine  was performed on the left lower extremity. Compression wraps were placed. The patient tolerated the procedure well.

## 2024-02-04 ENCOUNTER — Encounter (INDEPENDENT_AMBULATORY_CARE_PROVIDER_SITE_OTHER): Payer: Self-pay | Admitting: Nurse Practitioner

## 2024-02-04 ENCOUNTER — Ambulatory Visit (INDEPENDENT_AMBULATORY_CARE_PROVIDER_SITE_OTHER): Payer: Commercial Managed Care - PPO | Admitting: Nurse Practitioner

## 2024-02-04 VITALS — BP 138/79 | HR 93 | Resp 16 | Wt 150.0 lb

## 2024-02-04 DIAGNOSIS — I83819 Varicose veins of unspecified lower extremities with pain: Secondary | ICD-10-CM

## 2024-02-05 ENCOUNTER — Encounter (INDEPENDENT_AMBULATORY_CARE_PROVIDER_SITE_OTHER): Payer: Self-pay | Admitting: Nurse Practitioner

## 2024-02-05 NOTE — Progress Notes (Signed)
 Subjective:    Patient ID: Bailey Dorsey, female    DOB: Nov 18, 1956, 68 y.o.   MRN: [id number] Chief Complaint  Patient presents with   Follow-up    6 weeks follow up    The patient returns to the office for followup status post laser ablation of the left great saphenous vein on 05/30/2023.  The patient note significant improvement in the lower extremity pain but not resolution of the symptoms. The patient notes multiple residual varicosities bilaterally which continued to hurt with dependent positions and remained tender to palpation. The patient's swelling is minimally from preoperative status. The patient continues to wear graduated compression stockings on a daily basis but these are not eliminating the pain and discomfort. The patient continues to use over-the-counter anti-inflammatory medications to treat the pain and related symptoms but this has not given the patient relief. The patient notes the pain in the lower extremities is causing problems with daily exercise, problems at work and even with household activities such as preparing meals and doing dishes.  The patient is otherwise done well and there have been no complications related to the laser procedure or interval changes in the patient's overall   Post laser ultrasound shows successful ablation of the left great saphenous vein     Review of Systems  Cardiovascular:  Positive for leg swelling.  All other systems reviewed and are negative.      Objective:   Physical Exam Vitals reviewed.  HENT:     Head: Normocephalic.  Cardiovascular:     Rate and Rhythm: Normal rate.     Pulses: Normal pulses.  Pulmonary:     Effort: Pulmonary effort is normal.  Musculoskeletal:        General: Tenderness present.  Skin:    General: Skin is warm and dry.  Neurological:     Mental Status: She is alert and oriented to person, place, and time.  Psychiatric:        Mood and Affect: Mood normal.        Behavior: Behavior  normal.        Thought Content: Thought content normal.        Judgment: Judgment normal.     BP 138/79   Pulse 93   Resp 16   Wt 150 lb (68 kg)   BMI 29.29 kg/m   History reviewed. No pertinent past medical history.  Social History   Socioeconomic History   Marital status: Married    Spouse name: Not on file   Number of children: Not on file   Years of education: Not on file   Highest education level: Not on file  Occupational History   Not on file  Tobacco Use   Smoking status: Former    Current packs/day: 0.00    Types: Cigarettes    Quit date: 2017    Years since quitting: 8.1   Smokeless tobacco: Never  Substance and Sexual Activity   Alcohol use: No   Drug use: No   Sexual activity: Never  Other Topics Concern   Not on file  Social History Narrative   Not on file   Social Drivers of Health   Financial Resource Strain: Low Risk  (09/05/2023)   Received from [facility]   Overall Financial Resource Strain (CARDIA)    Difficulty of Paying Living Expenses: Not hard at all  Food Insecurity: No Food Insecurity (09/05/2023)   Received from [facility]   Hunger Vital Sign  Worried About Programme researcher, broadcasting/film/video in the Last Year: Never true    Ran Out of Food in the Last Year: Never true  Transportation Needs: No Transportation Needs (09/05/2023)   Received from [facility] - Transportation    In the past 12 months, has lack of transportation kept you from medical appointments or from getting medications?: No    Lack of Transportation (Non-Medical): No  Physical Activity: Not on file  Stress: Not on file  Social Connections: Unknown (03/24/2018)   [facility] from [facility]   Social Connection and Isolation Panel [NHANES]    Frequency of Communication with Friends and Family: Not asked    Frequency of Social Gatherings with Friends and Family: Not asked     Attends Religious Services: Not asked    Active Member of Clubs or Organizations: Not asked    Attends Banker Meetings: Not asked    Marital Status: Not asked  Intimate Partner Violence: Not on file    Past Surgical History:  Procedure Laterality Date   ESOPHAGOGASTRODUODENOSCOPY (EGD) WITH PROPOFOL N/A 10/29/2017   Procedure: ESOPHAGOGASTRODUODENOSCOPY (EGD) WITH PROPOFOL;  Surgeon: Midge Minium, MD;  Location: [facility] ENDOSCOPY;  Service: Endoscopy;  Laterality: N/A;    Family History  Problem Relation Age of Onset   Heart Problems Mother    Renal Disease Mother    Heart attack Father    Diabetes Sister     Allergies  Allergen Reactions   Amoxicillin-Pot Clavulanate Nausea And Vomiting       Latest Ref Rng & Units 10/30/2017    4:38 AM 10/29/2017    4:23 AM 10/28/2017   10:37 PM  CBC  WBC 3.6 - 11.0 K/uL 7.1  7.3    Hemoglobin 12.0 - 16.0 g/dL 8.3  8.0  8.2   Hematocrit 35.0 - 47.0 % 25.2  24.5  24.8   Platelets 150 - 440 K/uL 251  247        CMP     Component Value Date/Time   NA 141 06/06/2022 1550   K 4.5 06/06/2022 1550   CL 102 06/06/2022 1550   CO2 26 06/06/2022 1550   GLUCOSE 116 (H) 06/06/2022 1550   GLUCOSE 92 10/29/2017 0423   BUN 15 06/06/2022 1550   CREATININE 0.65 06/06/2022 1550   CALCIUM 10.1 06/06/2022 1550   PROT 7.4 06/06/2022 1550   ALBUMIN 4.4 06/06/2022 1550   AST 19 06/06/2022 1550   ALT 15 06/06/2022 1550   ALKPHOS 106 06/06/2022 1550   BILITOT 0.3 06/06/2022 1550   EGFR 97 06/06/2022 1550   GFRNONAA >60 10/29/2017 0423     No results found.     Assessment & Plan:   1. Varicose veins with pain Recommend:  The patient has had successful ablation of the previously incompetent saphenous venous system but still has persistent symptoms of pain and swelling that are having a negative impact on daily life and daily activities.  Patient should undergo injection sclerotherapy to treat the residual varicosities of  the left lower extremity.  The risks, benefits and alternative therapies were reviewed in detail with the patient.  All questions were answered.  The patient agrees to proceed with sclerotherapy at their convenience.  The patient will continue wearing the graduated compression stockings and using the over-the-counter pain medications to treat her symptoms.       Current Outpatient Medications on File Prior to Visit  Medication  Sig Dispense Refill   albuterol (VENTOLIN HFA) 108 (90 Base) MCG/ACT inhaler Inhale 2 puffs every 6 (six) hours as needed into the lungs for wheezing.     alendronate (FOSAMAX) 70 MG tablet Take 70 mg by mouth once a week.     atorvastatin (LIPITOR) 20 MG tablet Take 20 mg by mouth daily.     nystatin (MYCOSTATIN/NYSTOP) powder      busPIRone (BUSPAR) 5 MG tablet PLEASE SEE ATTACHED FOR DETAILED DIRECTIONS     methylPREDNISolone (MEDROL DOSEPAK) 4 MG TBPK tablet 6 day dose pack - take as directed (Patient not taking: Reported on 02/04/2024) 21 tablet 0   No current facility-administered medications on file prior to visit.    There are no Patient Instructions on file for this visit. No follow-ups on file.   Georgiana Spinner, NP

## 2024-03-04 ENCOUNTER — Ambulatory Visit: Admitting: Podiatry

## 2024-03-04 DIAGNOSIS — M722 Plantar fascial fibromatosis: Secondary | ICD-10-CM

## 2024-03-04 MED ORDER — TRIAMCINOLONE ACETONIDE 40 MG/ML IJ SUSP
20.0000 mg | Freq: Once | INTRAMUSCULAR | Status: AC
  Administered 2024-03-04: 20 mg

## 2024-03-04 NOTE — Progress Notes (Signed)
 She presents today for chief complaint of plantar heel pain right.  States has been aching for about a month.  Objective: Vital signs are stable alert and oriented x 3.  There is no erythema edema cellulitis drainage or odor.  She has pain on palpation medial calcaneal tubercle of the right heel.  Radiographs taken today do demonstrate a soft tissue increase in density of plantar fascial cannula insertion site with a significant plantar distally oriented calcaneal heel spur.  No fractures are identified.  There is soft tissue swelling of the ankle and the leg.  Assessment: Planter fasciitis right foot.  Plan: Discussed etiology pathology and surgical therapies I injected the right heel today 20 mg Kenalog 5 mg Marcaine point of maximal tenderness. Did dispense a plantar fascia brace and discussed appropriate shoe gear.

## 2024-03-05 ENCOUNTER — Telehealth: Payer: Self-pay | Admitting: Podiatry

## 2024-03-05 NOTE — Telephone Encounter (Signed)
 Patient had injections in right foot on 03/04/24 and now is experiencing pain. Patient is requesting to speak with nurse. Contact telephone number, [phone]

## 2024-03-06 NOTE — Telephone Encounter (Signed)
 Patient called and would like to speak with a nurse regarding pain in her foot.

## 2024-03-10 ENCOUNTER — Other Ambulatory Visit: Payer: Self-pay

## 2024-03-10 MED ORDER — METHYLPREDNISOLONE 4 MG PO TBPK: ORAL_TABLET | ORAL | 0 refills | Status: DC

## 2024-05-05 ENCOUNTER — Encounter (INDEPENDENT_AMBULATORY_CARE_PROVIDER_SITE_OTHER): Payer: Self-pay

## 2024-06-23 ENCOUNTER — Telehealth (INDEPENDENT_AMBULATORY_CARE_PROVIDER_SITE_OTHER): Payer: Self-pay | Admitting: Nurse Practitioner

## 2024-06-23 NOTE — Telephone Encounter (Signed)
 LVM for pt to advise that the prior authorization for the sclerotherapy is still pended and that I would be in touch as soon as I get an answer from insurance. She may call with any questions.

## 2024-07-07 ENCOUNTER — Telehealth (INDEPENDENT_AMBULATORY_CARE_PROVIDER_SITE_OTHER): Payer: Self-pay | Admitting: Nurse Practitioner

## 2024-07-07 NOTE — Telephone Encounter (Signed)
 Spoke with pt - she advised that she would like to wait until Septemberish to make the sclerotherapy appts. I advised that I may need to double check the auth as they can change requirements on a quarterly basis. Patient states that she will call back tomorrow when she looks at her schedule.

## 2024-07-13 ENCOUNTER — Ambulatory Visit: Admitting: Podiatry

## 2024-07-13 DIAGNOSIS — M722 Plantar fascial fibromatosis: Secondary | ICD-10-CM | POA: Diagnosis not present

## 2024-07-13 DIAGNOSIS — M629 Disorder of muscle, unspecified: Secondary | ICD-10-CM

## 2024-07-13 MED ORDER — TRIAMCINOLONE ACETONIDE 40 MG/ML IJ SUSP
20.0000 mg | Freq: Once | INTRAMUSCULAR | Status: AC
Start: 2024-07-13 — End: 2024-07-13
  Administered 2024-07-13: 20 mg

## 2024-07-13 MED ORDER — CELECOXIB 200 MG PO CAPS
200.0000 mg | ORAL_CAPSULE | Freq: Two times a day (BID) | ORAL | 3 refills | Status: DC
Start: 2024-07-13 — End: 2024-07-29

## 2024-07-13 NOTE — Progress Notes (Signed)
 She presents today for follow-up of her right plantar fasciitis.  She states that is absolutely no better than it was at the previous visit.  The injection did not help at all pain is primarily on the medial heel and the lateral side of the foot.  States that they she has been wearing her brace and her boot that she has.  States that her son purchased her pair of [place] and she is not able to wear the right one because the heel hurts so badly.  She states that this is starting to affect her ability perform her daily activities and enjoy health maintenance.  Objective: Vital signs are stable alert oriented x 3 pulses are palpable.  Radiographs reviewed.  Physical exam demonstrates severe pain on palpation medial calcaneal tubercle of the right heel with warmth on palpation.  She also has pain on the lateral aspect of the right foot overlying the fourth fifth tarsometatarsal joint consistent with lateral compensatory syndrome.  She does have some tenderness palpation of the peroneal tendons as well.  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  Plan: At this point due to failure of conservative therapies to render this patient asymptomatic I am going to request an MRI for surgical consideration and evaluation.  I did reinject the area today just to try to give her some relief until the MRI can be performed.

## 2024-07-14 ENCOUNTER — Other Ambulatory Visit: Payer: Self-pay

## 2024-07-14 DIAGNOSIS — M629 Disorder of muscle, unspecified: Secondary | ICD-10-CM

## 2024-07-14 DIAGNOSIS — M722 Plantar fascial fibromatosis: Secondary | ICD-10-CM

## 2024-07-25 ENCOUNTER — Ambulatory Visit
Admission: RE | Admit: 2024-07-25 | Discharge: 2024-07-25 | Disposition: A | Discharge status: Home / Self Care | Source: Ambulatory Visit | Attending: Podiatry | Admitting: Podiatry

## 2024-07-25 DIAGNOSIS — M629 Disorder of muscle, unspecified: Secondary | ICD-10-CM

## 2024-07-25 DIAGNOSIS — M722 Plantar fascial fibromatosis: Secondary | ICD-10-CM

## 2024-07-27 ENCOUNTER — Ambulatory Visit: Admitting: Podiatry

## 2024-07-29 ENCOUNTER — Encounter: Payer: Self-pay | Admitting: Physician Assistant

## 2024-07-29 ENCOUNTER — Ambulatory Visit: Admitting: Physician Assistant

## 2024-07-29 ENCOUNTER — Ambulatory Visit: Payer: Self-pay | Admitting: Podiatry

## 2024-07-29 DIAGNOSIS — L304 Erythema intertrigo: Secondary | ICD-10-CM

## 2024-07-29 DIAGNOSIS — L700 Acne vulgaris: Secondary | ICD-10-CM

## 2024-07-29 DIAGNOSIS — L820 Inflamed seborrheic keratosis: Secondary | ICD-10-CM

## 2024-07-29 DIAGNOSIS — L570 Actinic keratosis: Secondary | ICD-10-CM | POA: Diagnosis not present

## 2024-07-29 DIAGNOSIS — L739 Follicular disorder, unspecified: Secondary | ICD-10-CM | POA: Diagnosis not present

## 2024-07-29 DIAGNOSIS — L705 Acne excoriee des jeunes filles: Secondary | ICD-10-CM

## 2024-07-29 MED ORDER — CLOBETASOL PROPIONATE 0.05 % EX SOLN: 1.0000 | Freq: Two times a day (BID) | CUTANEOUS | 1 refills | Status: AC

## 2024-07-29 MED ORDER — KETOCONAZOLE 2 % EX SHAM: 1.0000 | MEDICATED_SHAMPOO | CUTANEOUS | 1 refills | Status: AC

## 2024-07-29 NOTE — Progress Notes (Signed)
   Follow-Up Visit   Subjective  Bailey Dorsey is a 68 y.o. female who presents for the following: Pt states she just has a few areas she would like evaluated. Just had full skin exam not long ago. No history of skin cancer.   Prior patient at [facility] in [place], [place].    The following portions of the chart were reviewed this encounter and updated as appropriate: medications, allergies, medical history  Review of Systems:  No other skin or systemic complaints except as noted in HPI or Assessment and Plan.  Objective  Well appearing patient in no apparent distress; mood and affect are within normal limits.  A focused examination was performed of the following areas:   Relevant exam findings are noted in the Assessment and Plan.  Left Lower Back, left breast (2) Erythematous stuck-on, waxy papule or plaque Left lower lip, right lower lip (2) Erythematous thin papules/macules with gritty scale.   Assessment & Plan   INTERTRIGO Exam: Clear today per patient   Treatment Plan: Continue Nystatin powder as needed.    Recommend bras without under wires.  ACNE VULGARIS Exam: Open comedones and inflammatory papules of shoulders   Treatment Plan: Recommend washing with Panoxyl 10 wash  FOLLICULITIS Exam: Perifollicular erythematous papules and pustules of scalp  Folliculitis occurs due to inflammation of the superficial hair follicle (pore), resulting in acne-like lesions (pus bumps). It can be infectious (bacterial, fungal) or noninfectious (shaving, tight clothing, heat/sweat, medications).  Folliculitis can be acute or chronic and recommended treatment depends on the underlying cause of folliculitis.  Treatment Plan: Clobetasol  solution twice daily to scalp as needed   INFLAMED SEBORRHEIC KERATOSIS (2) Left Lower Back, left breast (2) Symptomatic, irritating, patient would like treated.  Benign-appearing.  Call clinic for new or changing lesions.    Destruction of lesion - Left Lower Back, left breast (2) Complexity: simple   Destruction method: cryotherapy   Informed consent: discussed and consent obtained   Timeout:  patient name, date of birth, surgical site, and procedure verified Lesion destroyed using liquid nitrogen: Yes   Region frozen until ice ball extended beyond lesion: Yes   Outcome: patient tolerated procedure well with no complications   Post-procedure details: wound care instructions given    AK (ACTINIC KERATOSIS) (2) Left lower lip, right lower lip (2) Destruction of lesion - Left lower lip, right lower lip (2) Complexity: simple   Destruction method: cryotherapy   Informed consent: discussed and consent obtained   Timeout:  patient name, date of birth, surgical site, and procedure verified Lesion destroyed using liquid nitrogen: Yes   Region frozen until ice ball extended beyond lesion: Yes   Outcome: patient tolerated procedure well with no complications   Post-procedure details: wound care instructions given    ERYTHEMA INTERTRIGO   EXCORIATED ACNE   FOLLICULITIS    Return in about 5 months (around 12/29/2024) for TBSE.   I, Doyce Pan, CMA, am acting as scribe for Laurali Goddard K, PA-C.   Documentation: I have reviewed the above documentation for accuracy and completeness, and I agree with the above.  Sapna Padron K, PA-C

## 2024-07-29 NOTE — Patient Instructions (Addendum)

## 2024-07-29 NOTE — Telephone Encounter (Signed)
 Called patient and left voicemail to schedule appointment with Dr.Hyatt to discuss MRI results.

## 2024-08-03 ENCOUNTER — Other Ambulatory Visit

## 2024-08-10 ENCOUNTER — Encounter: Payer: Self-pay | Admitting: Physician Assistant

## 2024-08-28 ENCOUNTER — Encounter: Payer: Self-pay | Admitting: Physician Assistant

## 2024-08-31 ENCOUNTER — Encounter: Payer: Self-pay | Admitting: Physician Assistant

## 2024-09-09 ENCOUNTER — Ambulatory Visit: Admitting: Podiatry

## 2024-09-09 DIAGNOSIS — M722 Plantar fascial fibromatosis: Secondary | ICD-10-CM

## 2024-09-09 NOTE — Progress Notes (Signed)
 She presents today for review of her MRI.  States that her right medial heel is doing pretty good right now however she still has some tenderness overlying the sinus tarsi and the anterior lateral fat pad that is present with sometimes radiating pain that goes up her foot and leg.  Objective: No change in physical exam no pain on palpation medial calcaneal tubercle at this point.  MRI does demonstrate plantar fasciitis but no tears.  Assessment: Plantar fasciitis right.  Lipoma anterior lateral right.  Plan: Discussed etiology pathology and surgical therapies at this point I recommended holding off on surgery until the next flare and we will consider an endoscopic fasciotomy at that point.  She does not want to do physical therapy.

## 2024-09-10 ENCOUNTER — Encounter: Payer: Self-pay | Admitting: Podiatry

## 2024-09-14 NOTE — Telephone Encounter (Signed)
 Informed patient of out of work status after surgery is aware.

## 2024-10-05 ENCOUNTER — Ambulatory Visit: Admitting: Podiatry

## 2024-11-02 ENCOUNTER — Ambulatory Visit: Admitting: Podiatry

## 2024-11-04 ENCOUNTER — Encounter: Payer: Self-pay | Admitting: Physician Assistant

## 2024-12-02 ENCOUNTER — Ambulatory Visit: Admitting: Podiatry

## 2024-12-08 ENCOUNTER — Other Ambulatory Visit (INDEPENDENT_AMBULATORY_CARE_PROVIDER_SITE_OTHER): Payer: Self-pay | Admitting: Nurse Practitioner

## 2024-12-08 DIAGNOSIS — I83819 Varicose veins of unspecified lower extremities with pain: Secondary | ICD-10-CM

## 2024-12-14 ENCOUNTER — Telehealth (INDEPENDENT_AMBULATORY_CARE_PROVIDER_SITE_OTHER): Payer: Self-pay

## 2024-12-14 NOTE — Telephone Encounter (Signed)
 Patient called at this time upset and confused stating she had sclero sessions approved by insurance company per tanya 3 months ago and she is now being told she needs to return for ultrasounds again to have sclero, patient is confused and aggravated at this time. Please advise.

## 2024-12-21 ENCOUNTER — Encounter (INDEPENDENT_AMBULATORY_CARE_PROVIDER_SITE_OTHER): Payer: Self-pay | Admitting: Nurse Practitioner

## 2024-12-21 ENCOUNTER — Ambulatory Visit (INDEPENDENT_AMBULATORY_CARE_PROVIDER_SITE_OTHER)

## 2024-12-21 ENCOUNTER — Ambulatory Visit (INDEPENDENT_AMBULATORY_CARE_PROVIDER_SITE_OTHER): Admitting: Nurse Practitioner

## 2024-12-21 VITALS — BP 145/85 | HR 91 | Resp 18 | Ht <= 58 in | Wt 153.4 lb

## 2024-12-21 DIAGNOSIS — I83819 Varicose veins of unspecified lower extremities with pain: Secondary | ICD-10-CM

## 2024-12-27 ENCOUNTER — Encounter (INDEPENDENT_AMBULATORY_CARE_PROVIDER_SITE_OTHER): Payer: Self-pay | Admitting: Nurse Practitioner

## 2024-12-27 NOTE — Progress Notes (Signed)
 "  Subjective:    Patient ID: Bailey Dorsey, female    DOB: 13-Jul-1956, 69 y.o.   MRN: [id number] Chief Complaint  Patient presents with   Follow-up    Follow up reflux study to discuss more sclero     HPI  Discussed the use of AI scribe software for clinical note transcription with the patient, who gave verbal consent to proceed.  History of Present Illness Bailey Dorsey is a 69 year old female with chronic venous insufficiency and prior left leg ablation who presents for evaluation of recurrent right lower extremity symptoms.  She describes recurrent pruritus and burning in the right leg, with discomfort that has returned after prior improvement. Symptoms are similar to those experienced in the left leg prior to ablation. Prolonged standing at work exacerbates symptoms, and she notes increased swelling, heaviness, and aching in the right leg after several hours of ambulation.  She underwent endovenous laser ablation of the left saphenous vein in June 2024 for symptomatic varicose veins. She does not report new swelling, pain, or discomfort in the left leg related to venous disease. She does report left leg pain following a recent fall, described as severe and considering intervention for relief.  She continues to use compression stockings and practices conservative measures including leg elevation and ambulation. She is not on aspirin but regularly uses Goody powders for analgesia.    Results Diagnostic Right lower extremity venous duplex ultrasound (12/21/2024): Newly detected deep venous reflux; superficial saphenous vein reflux present.   Review of Systems     Objective:   Physical Exam  Physical Exam    BP (!) 145/85 (BP Location: Right Arm, Patient Position: Sitting, Cuff Size: Normal)   Pulse 91   Resp 18   Ht 4' 9 (1.448 m)   Wt 153 lb 6.4 oz (69.6 kg)   BMI 33.20 kg/m   History reviewed. No pertinent past medical history.  Social History    Socioeconomic History   Marital status: Married    Spouse name: Not on file   Number of children: Not on file   Years of education: Not on file   Highest education level: Not on file  Occupational History   Not on file  Tobacco Use   Smoking status: Former    Current packs/day: 0.00    Types: Cigarettes    Quit date: 2017    Years since quitting: 9.0   Smokeless tobacco: Never  Substance and Sexual Activity   Alcohol use: No   Drug use: No   Sexual activity: Never  Other Topics Concern   Not on file  Social History Narrative   Not on file   Social Drivers of Health   Tobacco Use: Medium Risk (12/27/2024)   Patient History    Smoking Tobacco Use: Former    Smokeless Tobacco Use: Never    Passive Exposure: Not on Actuary Strain: Low Risk  (09/07/2024)   Received from [facility]   Overall Financial Resource Strain (CARDIA)    Difficulty of Paying Living Expenses: Not hard at all  Food Insecurity: No Food Insecurity (09/07/2024)   Received from [facility]   Epic    Within the past 12 months, you worried that your food would run out before you got the money to buy more.: Never true    Within the past 12 months, the food you bought just didn't last and you didn't have money to get more.: Never true  Transportation Needs: No Transportation Needs (09/07/2024)   Received from [facility] - Transportation    In the past 12 months, has lack of transportation kept you from medical appointments or from getting medications?: No    Lack of Transportation (Non-Medical): No  Physical Activity: Not on file  Stress: Not on file  Social Connections: Not on file  Intimate Partner Violence: Not on file  Depression (EYV7-0): Not on file  Alcohol Screen: Not on file  Housing: Low Risk  (09/07/2024)   Received from [facility]   Epic    In the last 12 months, was there a time when you were  not able to pay the mortgage or rent on time?: No    In the past 12 months, how many times have you moved where you were living?: 0    At any time in the past 12 months, were you homeless or living in a shelter (including now)?: No  Utilities: Not At Risk (09/07/2024)   Received from [facility]   Epic    In the past 12 months has the electric, gas, oil, or water company threatened to shut off services in your home?: No  Health Literacy: Not on file    Past Surgical History:  Procedure Laterality Date   ESOPHAGOGASTRODUODENOSCOPY (EGD) WITH PROPOFOL  N/A 10/29/2017   Procedure: ESOPHAGOGASTRODUODENOSCOPY (EGD) WITH PROPOFOL ;  Surgeon: Jinny Carmine, MD;  Location: [facility] ENDOSCOPY;  Service: Endoscopy;  Laterality: N/A;    Family History  Problem Relation Age of Onset   Heart Problems Mother    Renal Disease Mother    Heart attack Father    Diabetes Sister     Allergies[1]     Latest Ref Rng & Units 10/30/2017    4:38 AM 10/29/2017    4:23 AM 10/28/2017   10:37 PM  CBC  WBC 3.6 - 11.0 K/uL 7.1  7.3    Hemoglobin 12.0 - 16.0 g/dL 8.3  8.0  8.2   Hematocrit 35.0 - 47.0 % 25.2  24.5  24.8   Platelets 150 - 440 K/uL 251  247        CMP     Component Value Date/Time   NA 141 06/06/2022 1550   K 4.5 06/06/2022 1550   CL 102 06/06/2022 1550   CO2 26 06/06/2022 1550   GLUCOSE 116 (H) 06/06/2022 1550   GLUCOSE 92 10/29/2017 0423   BUN 15 06/06/2022 1550   CREATININE 0.65 06/06/2022 1550   CALCIUM 10.1 06/06/2022 1550   PROT 7.4 06/06/2022 1550   ALBUMIN 4.4 06/06/2022 1550   AST 19 06/06/2022 1550   ALT 15 06/06/2022 1550   ALKPHOS 106 06/06/2022 1550   BILITOT 0.3 06/06/2022 1550   EGFR 97 06/06/2022 1550   GFRNONAA >60 10/29/2017 0423     No results found.     Assessment & Plan:   1. Varicose veins with pain (Primary) Recommend  I have reviewed my previous  discussion with the patient regarding  varicose veins and why they cause symptoms.  Patient will continue  wearing graduated compression stockings class 1 on a daily basis, beginning first thing in the morning and removing them in the evening.  The patient is CEAP [id number].  The patient has been wearing compression for more than 12 weeks with no or little benefit.  The patient has been exercising daily for more than 12 weeks. The patient has been elevating and taking OTC pain  medications for more than 12 weeks.  None of these have have eliminated the pain related to the varicose veins and venous reflux or the discomfort regarding venous congestion.    In addition, behavioral modification including elevation during the day was again discussed and this will continue.  The patient has utilized over the counter pain medications and has been exercising.  However, at this time conservative therapy has not alleviated the patient's symptoms of leg pain and swelling  Recommend: laser ablation of the right great saphenous veins to eliminate the symptoms of pain and swelling of the lower extremities caused by the severe superficial venous reflux disease.    Medications Ordered Prior to Encounter[2]  There are no Patient Instructions on file for this visit. No follow-ups on file.   Taneisha Fuson E Barry Culverhouse, NP      [1]  Allergies Allergen Reactions   Amoxicillin-Pot Clavulanate Nausea And Vomiting  [2]  Current Outpatient Medications on File Prior to Visit  Medication Sig Dispense Refill   albuterol  (VENTOLIN  HFA) 108 (90 Base) MCG/ACT inhaler Inhale 2 puffs every 6 (six) hours as needed into the lungs for wheezing.     alendronate (FOSAMAX) 70 MG tablet Take 70 mg by mouth once a week.     atorvastatin (LIPITOR) 20 MG tablet Take 20 mg by mouth daily.     clobetasol  (TEMOVATE ) 0.05 % external solution Apply 1 Application topically 2 (two) times daily. Apply to scalp twice daily as needed 50 mL 1   ketoconazole  (NIZORAL ) 2 % shampoo Apply 1 Application topically as directed. Apply to  scalp, let sit a few minutes before rinsing. 120 mL 1   nystatin (MYCOSTATIN/NYSTOP) powder      No current facility-administered medications on file prior to visit.   "

## 2024-12-28 ENCOUNTER — Ambulatory Visit: Admitting: Podiatry

## 2025-01-04 ENCOUNTER — Ambulatory Visit: Admitting: Physician Assistant

## 2025-01-20 ENCOUNTER — Telehealth (INDEPENDENT_AMBULATORY_CARE_PROVIDER_SITE_OTHER): Payer: Self-pay | Admitting: Vascular Surgery

## 2025-01-20 NOTE — Telephone Encounter (Signed)
 Called pt 2x and LVM for scheduling Laser Ablation with [facility]     Right Leg GSV laser see GS. No auth req. per Norleen NOVAK. from [place] [phone]) on 01/20/25. 1 unit total.   LO. 1 week post ablation Right leg GSV laser.    4 week post right leg GSV laser. See GS/FB

## 2025-01-21 ENCOUNTER — Encounter (INDEPENDENT_AMBULATORY_CARE_PROVIDER_SITE_OTHER): Payer: Self-pay

## 2025-01-22 ENCOUNTER — Telehealth (INDEPENDENT_AMBULATORY_CARE_PROVIDER_SITE_OTHER): Payer: Self-pay | Admitting: Vascular Surgery

## 2025-01-22 NOTE — Telephone Encounter (Signed)
 Done sent to NP for review

## 2025-01-22 NOTE — Telephone Encounter (Signed)
 Pt is scheduled for Laser Ablation with Jama Hacker MD. on 02/04/25. Patients pharmacy is the correct one on file (confirmed with pt). Please send pt RX in

## 2025-02-03 ENCOUNTER — Ambulatory Visit: Admitting: Podiatry

## 2025-02-04 ENCOUNTER — Other Ambulatory Visit (INDEPENDENT_AMBULATORY_CARE_PROVIDER_SITE_OTHER): Admitting: Vascular Surgery

## 2025-02-11 ENCOUNTER — Encounter (INDEPENDENT_AMBULATORY_CARE_PROVIDER_SITE_OTHER)

## 2025-03-03 ENCOUNTER — Ambulatory Visit: Admitting: Physician Assistant

## 2025-03-04 ENCOUNTER — Ambulatory Visit (INDEPENDENT_AMBULATORY_CARE_PROVIDER_SITE_OTHER): Admitting: Vascular Surgery
